# Patient Record
Sex: Male | Born: 1970
Health system: Southern US, Community
[De-identification: ages and names within clinical notes are randomized; demographics above are authoritative.]

## PROBLEM LIST (undated history)

## (undated) DIAGNOSIS — E119 Type 2 diabetes mellitus without complications: Secondary | ICD-10-CM

## (undated) DIAGNOSIS — G4730 Sleep apnea, unspecified: Secondary | ICD-10-CM

## (undated) DIAGNOSIS — Z973 Presence of spectacles and contact lenses: Secondary | ICD-10-CM

## (undated) DIAGNOSIS — K219 Gastro-esophageal reflux disease without esophagitis: Secondary | ICD-10-CM

## (undated) DIAGNOSIS — M1990 Unspecified osteoarthritis, unspecified site: Secondary | ICD-10-CM

## (undated) DIAGNOSIS — I10 Essential (primary) hypertension: Secondary | ICD-10-CM

## (undated) DIAGNOSIS — H9192 Unspecified hearing loss, left ear: Secondary | ICD-10-CM

## (undated) DIAGNOSIS — IMO0001 Reserved for inherently not codable concepts without codable children: Secondary | ICD-10-CM

## (undated) DIAGNOSIS — M069 Rheumatoid arthritis, unspecified: Secondary | ICD-10-CM

## (undated) HISTORY — PX: OTHER SURGICAL HISTORY: SHX169

## (undated) HISTORY — PX: WISDOM TOOTH EXTRACTION: SHX21

## (undated) HISTORY — PX: FINGER AMPUTATION: SHX636

---

## 2005-12-02 ENCOUNTER — Emergency Department (HOSPITAL_COMMUNITY): Admission: EM | Admit: 2005-12-02 | Discharge: 2005-12-02 | Payer: Self-pay | Admitting: Emergency Medicine

## 2010-06-23 ENCOUNTER — Emergency Department (HOSPITAL_COMMUNITY)
Admission: EM | Admit: 2010-06-23 | Discharge: 2010-06-23 | Disposition: A | Discharge status: Home / Self Care | Payer: Self-pay | Attending: Emergency Medicine | Admitting: Emergency Medicine

## 2010-06-23 ENCOUNTER — Emergency Department (HOSPITAL_COMMUNITY): Payer: Self-pay

## 2010-06-23 DIAGNOSIS — J40 Bronchitis, not specified as acute or chronic: Secondary | ICD-10-CM | POA: Insufficient documentation

## 2010-06-23 DIAGNOSIS — R05 Cough: Secondary | ICD-10-CM | POA: Insufficient documentation

## 2010-06-23 DIAGNOSIS — R079 Chest pain, unspecified: Secondary | ICD-10-CM | POA: Insufficient documentation

## 2010-06-23 DIAGNOSIS — R0602 Shortness of breath: Secondary | ICD-10-CM | POA: Insufficient documentation

## 2010-06-23 DIAGNOSIS — F17200 Nicotine dependence, unspecified, uncomplicated: Secondary | ICD-10-CM | POA: Insufficient documentation

## 2010-06-23 DIAGNOSIS — R059 Cough, unspecified: Secondary | ICD-10-CM | POA: Insufficient documentation

## 2010-06-23 DIAGNOSIS — M549 Dorsalgia, unspecified: Secondary | ICD-10-CM | POA: Insufficient documentation

## 2012-05-16 HISTORY — PX: TUMOR REMOVAL: SHX12

## 2013-08-14 ENCOUNTER — Other Ambulatory Visit (HOSPITAL_COMMUNITY): Payer: Self-pay | Admitting: Rheumatology

## 2013-08-14 ENCOUNTER — Ambulatory Visit (HOSPITAL_COMMUNITY)
Admission: RE | Admit: 2013-08-14 | Discharge: 2013-08-14 | Disposition: A | Discharge status: Home / Self Care | Payer: BC Managed Care – PPO | Source: Ambulatory Visit | Attending: Rheumatology | Admitting: Rheumatology

## 2013-08-14 DIAGNOSIS — R52 Pain, unspecified: Secondary | ICD-10-CM

## 2013-08-15 ENCOUNTER — Ambulatory Visit
Admission: RE | Admit: 2013-08-15 | Discharge: 2013-08-15 | Disposition: A | Discharge status: Home / Self Care | Payer: BC Managed Care – PPO | Source: Ambulatory Visit | Attending: Family Medicine | Admitting: Family Medicine

## 2013-08-15 ENCOUNTER — Other Ambulatory Visit: Payer: Self-pay | Admitting: Family Medicine

## 2013-08-15 DIAGNOSIS — R9389 Abnormal findings on diagnostic imaging of other specified body structures: Secondary | ICD-10-CM

## 2014-03-04 ENCOUNTER — Other Ambulatory Visit: Payer: Self-pay | Admitting: Family Medicine

## 2014-03-04 DIAGNOSIS — R911 Solitary pulmonary nodule: Secondary | ICD-10-CM

## 2014-03-07 ENCOUNTER — Ambulatory Visit
Admission: RE | Admit: 2014-03-07 | Discharge: 2014-03-07 | Disposition: A | Discharge status: Home / Self Care | Payer: BC Managed Care – PPO | Source: Ambulatory Visit | Attending: Family Medicine | Admitting: Family Medicine

## 2014-03-07 DIAGNOSIS — R911 Solitary pulmonary nodule: Secondary | ICD-10-CM

## 2015-03-05 ENCOUNTER — Other Ambulatory Visit: Payer: Self-pay | Admitting: Specialist

## 2015-03-05 DIAGNOSIS — M25511 Pain in right shoulder: Secondary | ICD-10-CM

## 2015-03-23 ENCOUNTER — Ambulatory Visit
Admission: RE | Admit: 2015-03-23 | Discharge: 2015-03-23 | Disposition: A | Discharge status: Home / Self Care | Payer: BLUE CROSS/BLUE SHIELD | Source: Ambulatory Visit | Attending: Specialist | Admitting: Specialist

## 2015-03-23 ENCOUNTER — Other Ambulatory Visit: Payer: Self-pay | Admitting: Specialist

## 2015-03-23 DIAGNOSIS — M25511 Pain in right shoulder: Secondary | ICD-10-CM

## 2015-03-23 MED ORDER — IOHEXOL 180 MG/ML  SOLN
15.0000 mL | Freq: Once | INTRAMUSCULAR | Status: DC | PRN
  Administered 2015-03-23: 15 mL via INTRA_ARTICULAR

## 2015-04-14 ENCOUNTER — Other Ambulatory Visit (HOSPITAL_COMMUNITY): Payer: Self-pay | Admitting: Orthopedic Surgery

## 2015-04-28 ENCOUNTER — Other Ambulatory Visit (HOSPITAL_COMMUNITY): Payer: Self-pay | Admitting: *Deleted

## 2015-04-28 NOTE — Pre-Procedure Instructions (Addendum)
    Deshae Jagielski  04/28/2015      [id number] [place], [place] - [address] [place] [address] Phone: [phone] Fax: [phone]    Your procedure is scheduled on Thursday, May 07, 2015 at 2:30 PM.  Report to [facility] Entrance "A" Admitting Office at 12:30 PM.  Call this number if you have problems the morning of surgery: [phone]  Any questions prior to day of surgery, please call [phone] between 8 & 4 PM.   Remember:  Do not eat food or drink liquids after midnight Wednesday, 05/06/15.  Take these medicines the morning of surgery with A SIP OF WATER: Astelin nasal spray - if needed  Stop Fish Oil and Herbal medications as of tomorrow.   Do not wear jewelry.  Do not wear lotions, powders, or cologne.  You may NOT wear deodorant.  Men may shave face and neck.  Do not bring valuables to the hospital.  [facility] is not responsible for any belongings or valuables.  Contacts, dentures or bridgework may not be worn into surgery.  Leave your suitcase in the car.  After surgery it may be brought to your room.  For patients admitted to the hospital, discharge time will be determined by your treatment team.  Patients discharged the day of surgery will not be allowed to drive home.   Special instructions:  See "Preparing for Surgery" Instruction sheet.  Please read over the following fact sheets that you were given. Pain Booklet, Coughing and Deep Breathing and Surgical Site Infection Prevention

## 2015-04-29 ENCOUNTER — Encounter (HOSPITAL_COMMUNITY): Payer: Self-pay

## 2015-04-29 ENCOUNTER — Encounter (HOSPITAL_COMMUNITY)
Admission: RE | Admit: 2015-04-29 | Discharge: 2015-04-29 | Disposition: A | Discharge status: Home / Self Care | Payer: BLUE CROSS/BLUE SHIELD | Source: Ambulatory Visit | Attending: Orthopedic Surgery | Admitting: Orthopedic Surgery

## 2015-04-29 DIAGNOSIS — M75101 Unspecified rotator cuff tear or rupture of right shoulder, not specified as traumatic: Secondary | ICD-10-CM | POA: Diagnosis not present

## 2015-04-29 DIAGNOSIS — Z01812 Encounter for preprocedural laboratory examination: Secondary | ICD-10-CM | POA: Insufficient documentation

## 2015-04-29 DIAGNOSIS — M7521 Bicipital tendinitis, right shoulder: Secondary | ICD-10-CM | POA: Insufficient documentation

## 2015-04-29 HISTORY — DX: Gastro-esophageal reflux disease without esophagitis: K21.9

## 2015-04-29 HISTORY — DX: Unspecified osteoarthritis, unspecified site: M19.90

## 2015-04-29 HISTORY — DX: Sleep apnea, unspecified: G47.30

## 2015-04-29 HISTORY — DX: Reserved for inherently not codable concepts without codable children: IMO0001

## 2015-04-29 LAB — CBC
HCT: 48.3 % (ref 39.0–52.0)
Hemoglobin: 15.7 g/dL (ref 13.0–17.0)
MCH: 28 pg (ref 26.0–34.0)
MCHC: 32.5 g/dL (ref 30.0–36.0)
MCV: 86.1 fL (ref 78.0–100.0)
PLATELETS: 239 10*3/uL (ref 150–400)
RBC: 5.61 MIL/uL (ref 4.22–5.81)
RDW: 13.5 % (ref 11.5–15.5)
WBC: 9 10*3/uL (ref 4.0–10.5)

## 2015-05-06 MED ORDER — DEXTROSE 5 % IV SOLN
3.0000 g | INTRAVENOUS | Status: AC
  Administered 2015-05-07: 3 g via INTRAVENOUS
  Filled 2015-05-06: qty 3000

## 2015-05-06 MED ORDER — CHLORHEXIDINE GLUCONATE 4 % EX LIQD: 60.0000 mL | Freq: Once | CUTANEOUS | Status: DC

## 2015-05-07 ENCOUNTER — Encounter (HOSPITAL_COMMUNITY): Payer: Self-pay | Admitting: Surgery

## 2015-05-07 ENCOUNTER — Ambulatory Visit (HOSPITAL_COMMUNITY): Payer: BLUE CROSS/BLUE SHIELD | Admitting: Certified Registered Nurse Anesthetist

## 2015-05-07 ENCOUNTER — Ambulatory Visit (HOSPITAL_COMMUNITY)
Admission: RE | Admit: 2015-05-07 | Discharge: 2015-05-07 | Disposition: A | Discharge status: Home / Self Care | Payer: BLUE CROSS/BLUE SHIELD | Source: Ambulatory Visit | Attending: Orthopedic Surgery | Admitting: Orthopedic Surgery

## 2015-05-07 ENCOUNTER — Encounter (HOSPITAL_COMMUNITY)
Admission: RE | Disposition: A | Discharge status: Home / Self Care | Payer: Self-pay | Source: Ambulatory Visit | Attending: Orthopedic Surgery

## 2015-05-07 DIAGNOSIS — M13811 Other specified arthritis, right shoulder: Secondary | ICD-10-CM | POA: Diagnosis present

## 2015-05-07 DIAGNOSIS — M7551 Bursitis of right shoulder: Secondary | ICD-10-CM | POA: Diagnosis present

## 2015-05-07 DIAGNOSIS — F17200 Nicotine dependence, unspecified, uncomplicated: Secondary | ICD-10-CM | POA: Insufficient documentation

## 2015-05-07 DIAGNOSIS — G4730 Sleep apnea, unspecified: Secondary | ICD-10-CM | POA: Diagnosis not present

## 2015-05-07 DIAGNOSIS — K219 Gastro-esophageal reflux disease without esophagitis: Secondary | ICD-10-CM | POA: Diagnosis not present

## 2015-05-07 DIAGNOSIS — Z79899 Other long term (current) drug therapy: Secondary | ICD-10-CM | POA: Insufficient documentation

## 2015-05-07 HISTORY — PX: SHOULDER ARTHROSCOPY WITH DISTAL CLAVICLE RESECTION: SHX5675

## 2015-05-07 SURGERY — SHOULDER ARTHROSCOPY WITH DISTAL CLAVICLE RESECTION
Anesthesia: Regional | Site: Shoulder | Laterality: Right

## 2015-05-07 MED ORDER — BUPIVACAINE-EPINEPHRINE (PF) 0.5% -1:200000 IJ SOLN
INTRAMUSCULAR | Status: DC | PRN
  Administered 2015-05-07: 30 mL via PERINEURAL

## 2015-05-07 MED ORDER — MIDAZOLAM HCL 2 MG/2ML IJ SOLN
INTRAMUSCULAR | Status: AC
  Administered 2015-05-07: 1 mg
  Filled 2015-05-07: qty 2

## 2015-05-07 MED ORDER — PROPOFOL 10 MG/ML IV BOLUS
INTRAVENOUS | Status: AC
  Filled 2015-05-07: qty 40

## 2015-05-07 MED ORDER — PROPOFOL 10 MG/ML IV BOLUS
INTRAVENOUS | Status: AC
  Filled 2015-05-07: qty 20

## 2015-05-07 MED ORDER — DEXTROSE 5 % IV SOLN
10.0000 mg | INTRAVENOUS | Status: DC | PRN
  Administered 2015-05-07: 15 ug/min via INTRAVENOUS

## 2015-05-07 MED ORDER — ONDANSETRON HCL 4 MG/2ML IJ SOLN: 4.0000 mg | Freq: Four times a day (QID) | INTRAMUSCULAR | Status: DC | PRN

## 2015-05-07 MED ORDER — SODIUM CHLORIDE 0.9 % IR SOLN
Status: DC | PRN
  Administered 2015-05-07: 1000 mL
  Administered 2015-05-07: 6000 mL

## 2015-05-07 MED ORDER — SUGAMMADEX SODIUM 500 MG/5ML IV SOLN
INTRAVENOUS | Status: AC
  Filled 2015-05-07: qty 5

## 2015-05-07 MED ORDER — EPINEPHRINE HCL 1 MG/ML IJ SOLN
INTRAMUSCULAR | Status: DC | PRN
  Administered 2015-05-07: .1 mL

## 2015-05-07 MED ORDER — LIDOCAINE HCL (CARDIAC) 20 MG/ML IV SOLN
INTRAVENOUS | Status: AC
  Filled 2015-05-07: qty 5

## 2015-05-07 MED ORDER — FENTANYL CITRATE (PF) 100 MCG/2ML IJ SOLN: 25.0000 ug | INTRAMUSCULAR | Status: DC | PRN

## 2015-05-07 MED ORDER — FENTANYL CITRATE (PF) 100 MCG/2ML IJ SOLN
INTRAMUSCULAR | Status: AC
  Administered 2015-05-07: 50 ug
  Filled 2015-05-07: qty 2

## 2015-05-07 MED ORDER — FENTANYL CITRATE (PF) 100 MCG/2ML IJ SOLN
INTRAMUSCULAR | Status: DC | PRN
  Administered 2015-05-07: 100 ug via INTRAVENOUS
  Administered 2015-05-07 (×3): 50 ug via INTRAVENOUS

## 2015-05-07 MED ORDER — ROCURONIUM BROMIDE 100 MG/10ML IV SOLN
INTRAVENOUS | Status: DC | PRN
  Administered 2015-05-07: 30 mg via INTRAVENOUS
  Administered 2015-05-07: 20 mg via INTRAVENOUS
  Administered 2015-05-07: 10 mg via INTRAVENOUS

## 2015-05-07 MED ORDER — FENTANYL CITRATE (PF) 250 MCG/5ML IJ SOLN
INTRAMUSCULAR | Status: AC
  Filled 2015-05-07: qty 5

## 2015-05-07 MED ORDER — ONDANSETRON HCL 4 MG/2ML IJ SOLN
INTRAMUSCULAR | Status: DC | PRN
  Administered 2015-05-07: 4 mg via INTRAVENOUS

## 2015-05-07 MED ORDER — BUPIVACAINE HCL (PF) 0.25 % IJ SOLN
INTRAMUSCULAR | Status: AC
  Filled 2015-05-07: qty 30

## 2015-05-07 MED ORDER — OXYCODONE HCL 5 MG PO TABS: 5.0000 mg | ORAL_TABLET | Freq: Once | ORAL | Status: DC | PRN

## 2015-05-07 MED ORDER — MIDAZOLAM HCL 2 MG/2ML IJ SOLN
INTRAMUSCULAR | Status: AC
  Filled 2015-05-07: qty 2

## 2015-05-07 MED ORDER — SUGAMMADEX SODIUM 500 MG/5ML IV SOLN
INTRAVENOUS | Status: DC | PRN
  Administered 2015-05-07: 325 mg via INTRAVENOUS

## 2015-05-07 MED ORDER — PROPOFOL 10 MG/ML IV BOLUS
INTRAVENOUS | Status: DC | PRN
  Administered 2015-05-07: 300 mg via INTRAVENOUS

## 2015-05-07 MED ORDER — PHENYLEPHRINE HCL 10 MG/ML IJ SOLN
INTRAMUSCULAR | Status: DC | PRN
  Administered 2015-05-07 (×5): 40 ug via INTRAVENOUS

## 2015-05-07 MED ORDER — EPINEPHRINE HCL 1 MG/ML IJ SOLN
INTRAMUSCULAR | Status: AC
  Filled 2015-05-07: qty 1

## 2015-05-07 MED ORDER — ROCURONIUM BROMIDE 50 MG/5ML IV SOLN
INTRAVENOUS | Status: AC
  Filled 2015-05-07: qty 1

## 2015-05-07 MED ORDER — LACTATED RINGERS IV SOLN
INTRAVENOUS | Status: DC
  Administered 2015-05-07 (×3): via INTRAVENOUS

## 2015-05-07 MED ORDER — SODIUM CHLORIDE 0.9 % IJ SOLN
INTRAMUSCULAR | Status: DC | PRN
  Administered 2015-05-07: 50 mL via INTRAVENOUS

## 2015-05-07 MED ORDER — OXYCODONE HCL 5 MG/5ML PO SOLN: 5.0000 mg | Freq: Once | ORAL | Status: DC | PRN

## 2015-05-07 MED ORDER — SUCCINYLCHOLINE CHLORIDE 20 MG/ML IJ SOLN
INTRAMUSCULAR | Status: DC | PRN
  Administered 2015-05-07: 160 mg via INTRAVENOUS

## 2015-05-07 SURGICAL SUPPLY — 74 items
AID PSTN UNV HD RSTRNT DISP (MISCELLANEOUS) ×1
APL SKNCLS STERI-STRIP NONHPOA (GAUZE/BANDAGES/DRESSINGS) ×1
BENZOIN TINCTURE PRP APPL 2/3 (GAUZE/BANDAGES/DRESSINGS) ×2 IMPLANT
BLADE CUTTER GATOR 3.5 (BLADE) ×2 IMPLANT
BLADE GREAT WHITE 4.2 (BLADE) ×2 IMPLANT
BLADE LONG MED 31X9 (MISCELLANEOUS) ×1 IMPLANT
BLADE SURG 11 STRL SS (BLADE) ×2 IMPLANT
BUR OVAL 4.0 (BURR) ×1 IMPLANT
BUR OVAL 6.0 (BURR) ×3 IMPLANT
COVER SURGICAL LIGHT HANDLE (MISCELLANEOUS) ×2 IMPLANT
DRAPE INCISE IOBAN 66X45 STRL (DRAPES) ×4 IMPLANT
DRAPE STERI 35X30 U-POUCH (DRAPES) ×2 IMPLANT
DRAPE U-SHAPE 47X51 STRL (DRAPES) ×4 IMPLANT
DRSG PAD ABDOMINAL 8X10 ST (GAUZE/BANDAGES/DRESSINGS) ×6 IMPLANT
DRSG TEGADERM 4X4.75 (GAUZE/BANDAGES/DRESSINGS) ×1 IMPLANT
DURAPREP 26ML APPLICATOR (WOUND CARE) ×2 IMPLANT
ELECT REM PT RETURN 9FT ADLT (ELECTROSURGICAL) ×2
ELECTRODE REM PT RTRN 9FT ADLT (ELECTROSURGICAL) ×1 IMPLANT
FILTER STRAW FLUID ASPIR (MISCELLANEOUS) ×2 IMPLANT
GAUZE SPONGE 2X2 8PLY STRL LF (GAUZE/BANDAGES/DRESSINGS) IMPLANT
GAUZE SPONGE 4X4 12PLY STRL (GAUZE/BANDAGES/DRESSINGS) ×2 IMPLANT
GAUZE XEROFORM 1X8 LF (GAUZE/BANDAGES/DRESSINGS) ×2 IMPLANT
GLOVE BIOGEL PI IND STRL 7.5 (GLOVE) ×1 IMPLANT
GLOVE BIOGEL PI IND STRL 8 (GLOVE) ×1 IMPLANT
GLOVE BIOGEL PI INDICATOR 7.5 (GLOVE) ×1
GLOVE BIOGEL PI INDICATOR 8 (GLOVE) ×1
GLOVE ECLIPSE 7.0 STRL STRAW (GLOVE) ×2 IMPLANT
GLOVE ECLIPSE 8.0 STRL XLNG CF (GLOVE) ×1 IMPLANT
GLOVE SURG ORTHO 8.0 STRL STRW (GLOVE) ×2 IMPLANT
GOWN STRL REUS W/ TWL LRG LVL3 (GOWN DISPOSABLE) IMPLANT
GOWN STRL REUS W/ TWL XL LVL3 (GOWN DISPOSABLE) ×2 IMPLANT
GOWN STRL REUS W/TWL LRG LVL3 (GOWN DISPOSABLE)
GOWN STRL REUS W/TWL XL LVL3 (GOWN DISPOSABLE) ×4
HOVERMATT SINGLE USE (MISCELLANEOUS) ×1 IMPLANT
KIT BASIN OR (CUSTOM PROCEDURE TRAY) ×2 IMPLANT
KIT ROOM TURNOVER OR (KITS) ×2 IMPLANT
MANIFOLD NEPTUNE II (INSTRUMENTS) ×2 IMPLANT
NDL HYPO 25X1 1.5 SAFETY (NEEDLE) ×1 IMPLANT
NDL SCORPION MULTI FIRE (NEEDLE) ×1 IMPLANT
NDL SPNL 18GX3.5 QUINCKE PK (NEEDLE) ×1 IMPLANT
NDL SUT 6 .5 CRC .975X.05 MAYO (NEEDLE) ×1 IMPLANT
NEEDLE HYPO 25X1 1.5 SAFETY (NEEDLE) ×2 IMPLANT
NEEDLE MAYO TAPER (NEEDLE)
NEEDLE SCORPION MULTI FIRE (NEEDLE) ×2 IMPLANT
NEEDLE SPNL 18GX3.5 QUINCKE PK (NEEDLE) ×2 IMPLANT
NS IRRIG 1000ML POUR BTL (IV SOLUTION) ×2 IMPLANT
PACK SHOULDER (CUSTOM PROCEDURE TRAY) ×2 IMPLANT
PAD ARMBOARD 7.5X6 YLW CONV (MISCELLANEOUS) ×4 IMPLANT
RESTRAINT HEAD UNIVERSAL NS (MISCELLANEOUS) ×2 IMPLANT
SET ARTHROSCOPY TUBING (MISCELLANEOUS) ×2
SET ARTHROSCOPY TUBING LN (MISCELLANEOUS) ×1 IMPLANT
SLING ARM IMMOBILIZER MED (SOFTGOODS) IMPLANT
SLING ARM IMMOBILIZER XL (CAST SUPPLIES) ×1 IMPLANT
SPONGE GAUZE 2X2 STER 10/PKG (GAUZE/BANDAGES/DRESSINGS) ×1
SPONGE LAP 4X18 X RAY DECT (DISPOSABLE) ×3 IMPLANT
STRIP CLOSURE SKIN 1/2X4 (GAUZE/BANDAGES/DRESSINGS) ×2 IMPLANT
SUCTION FRAZIER TIP 10 FR DISP (SUCTIONS) ×2 IMPLANT
SUT ETHILON 3 0 PS 1 (SUTURE) ×3 IMPLANT
SUT FIBERWIRE #2 38 T-5 BLUE (SUTURE)
SUT PROLENE 3 0 PS 2 (SUTURE) ×1 IMPLANT
SUT VIC AB 0 CT1 27 (SUTURE) ×2
SUT VIC AB 0 CT1 27XBRD ANBCTR (SUTURE) ×2 IMPLANT
SUT VIC AB 1 CT1 27 (SUTURE)
SUT VIC AB 1 CT1 27XBRD ANBCTR (SUTURE) ×1 IMPLANT
SUT VIC AB 2-0 CT1 27 (SUTURE) ×2
SUT VIC AB 2-0 CT1 TAPERPNT 27 (SUTURE) ×1 IMPLANT
SUTURE FIBERWR #2 38 T-5 BLUE (SUTURE) IMPLANT
SYR 20CC LL (SYRINGE) ×4 IMPLANT
SYR TB 1ML LUER SLIP (SYRINGE) ×2 IMPLANT
TOWEL OR 17X24 6PK STRL BLUE (TOWEL DISPOSABLE) ×2 IMPLANT
TOWEL OR 17X26 10 PK STRL BLUE (TOWEL DISPOSABLE) ×2 IMPLANT
WAND 90 DEG TURBOVAC W/CORD (SURGICAL WAND) ×1 IMPLANT
WAND HAND CNTRL MULTIVAC 90 (MISCELLANEOUS) ×1 IMPLANT
WATER STERILE IRR 1000ML POUR (IV SOLUTION) ×2 IMPLANT

## 2015-05-07 NOTE — Anesthesia Postprocedure Evaluation (Signed)
Anesthesia Post Note  Patient: Darren Sweeney  Procedure(s) Performed: Procedure(s) (LRB): SHOULDER DIAGNOSTIC OPERATIVE ARTHROSCOPY WITH DEBRIDEMENT AND DISTAL CLAVICLE EXCISION (Right)  Patient location during evaluation: PACU Anesthesia Type: General Level of consciousness: awake, awake and alert, oriented and patient cooperative Pain management: pain level controlled Vital Signs Assessment: post-procedure vital signs reviewed and stable Respiratory status: spontaneous breathing and respiratory function stable Cardiovascular status: blood pressure returned to baseline Anesthetic complications: no    Last Vitals:  Filed Vitals:   05/07/15 1958 05/07/15 2000  BP: 116/70   Pulse: 81 79  Temp: 36.9 C   Resp: 29 30    Last Pain:  Filed Vitals:   05/07/15 2006  PainSc: [facility]

## 2015-05-07 NOTE — H&P (Addendum)
Darren Sweeney is an 44 y.o. male.   Chief Complaint: Right shoulder pain HPI: Darren Sweeney 44 year old patient with long history of right shoulder pain. Bothering him now for almost a year. He localizes the pain to the superior aspect of the shoulder MRI scans consistent with [facility] joint arthropathy.  He has had an injection into the [facility] joint which did give him some but not sustained relief he presents now for operative management after failure of conservative management he has pain which interferes with his sleep as well as his range of motion of his left arm. MRI scan did not demonstrate any cuff pathology or labral pathology.But did show ac joint arthropathy  Past Medical History  Diagnosis Date  . Sleep apnea   . Shortness of breath dyspnea     on exertion  . GERD (gastroesophageal reflux disease)   . Arthritis     Past Surgical History  Procedure Laterality Date  . Finger amputation Left     ring finger amputation  . [place] teeth    . Tumor removal  2014    on voice box    History reviewed. No pertinent family history. Social History:  reports that he has been smoking Cigarettes.  He has a 42 pack-year smoking history. He does not have any smokeless tobacco history on file. He reports that he does not drink alcohol or use illicit drugs.  Allergies: No Known Allergies  Medications Prior to Admission  Medication Sig Dispense Refill  . atorvastatin (LIPITOR) 10 MG tablet Take 10 mg by mouth daily.  5  . diclofenac (VOLTAREN) 75 MG EC tablet Take 75 mg by mouth 2 (two) times daily with a meal.  2  . folic acid (FOLVITE) 1 MG tablet Take 1 mg by mouth 2 (two) times daily.    . hydroxychloroquine (PLAQUENIL) 200 MG tablet Take 400 mg by mouth daily.  0  . methotrexate 50 MG/2ML injection Inject 25 mg into the muscle once a week. Sundays  2  . Misc Natural Products (TART CHERRY ADVANCED PO) Take 1 capsule by mouth daily.    . Omega 3 1000 MG CAPS Take 1,000 mg by mouth 2 (two) times  daily.    Marland Kitchen omeprazole (PRILOSEC) 40 MG capsule Take 40 mg by mouth at bedtime.  3  . vitamin B-12 (CYANOCOBALAMIN) 500 MCG tablet Take 500 mcg by mouth daily.    Marland Kitchen azelastine (ASTELIN) 0.1 % nasal spray Place 1 spray into both nostrils daily as needed for rhinitis or allergies.   1    No results found for this or any previous visit (from the past 48 hour(s)). No results found.  Review of Systems  Constitutional: Negative.   HENT: Negative.   Eyes: Negative.   Respiratory: Negative.   Cardiovascular: Negative.   Gastrointestinal: Negative.   Genitourinary: Negative.   Musculoskeletal: Positive for joint pain.  Skin: Negative.   Neurological: Negative.   Endo/Heme/Allergies: Negative.   Psychiatric/Behavioral: Negative.     Blood pressure 136/78, pulse 76, temperature 98.5 F (36.9 C), temperature source Oral, resp. rate 26, height 5\' 10"  (1.778 m), weight 152.862 kg (337 lb), SpO2 98 %. Physical Exam  Constitutional: He appears well-developed.  HENT:  Head: Normocephalic.  Eyes: Pupils are equal, round, and reactive to light.  Neck: Normal range of motion.  Cardiovascular: Normal rate.   Respiratory: Effort normal.  Musculoskeletal: Normal range of motion.  Neurological: He is alert.  Skin: Skin is warm.   semination rightt shoulder  demonstrates full active passive range of motion AC joint tenderness is present pain with crossarm adduction present rotator cuff strength intact  Apprehension relocation testing negative O'Brien's testing no restriction of external rotation at 15 abduction   Assessment/Plan Impression is refractory acromioclavicular joint arthritis on the right shoulder plan arthroscopy distal clavicle excision subacromial decompression risk benefits discussed with patient including but not limited to  infection or vessel damage shoulder stiffness incomplete pain relief patient understands risk benefits and wisheds toproceed all questions  answered  DEAN,GREGORY SCOTT 05/07/2015, 4:41 PM

## 2015-05-07 NOTE — Anesthesia Preprocedure Evaluation (Signed)
Anesthesia Evaluation  Patient identified by MRN, date of birth, ID band Patient awake    Reviewed: Allergy & Precautions, NPO status , Patient's Chart, lab work & pertinent test results  Airway Mallampati: II   Neck ROM: full    Dental   Pulmonary shortness of breath, sleep apnea , Current Smoker,    breath sounds clear to auscultation       Cardiovascular negative cardio ROS   Rhythm:regular Rate:Normal     Neuro/Psych    GI/Hepatic GERD  ,  Endo/Other  Morbid obesity  Renal/GU      Musculoskeletal  (+) Arthritis ,   Abdominal   Peds  Hematology   Anesthesia Other Findings   Reproductive/Obstetrics                             Anesthesia Physical Anesthesia Plan  ASA: II  Anesthesia Plan: General and Regional   Post-op Pain Management: MAC Combined w/ Regional for Post-op pain   Induction: Intravenous  Airway Management Planned: Oral ETT  Additional Equipment:   Intra-op Plan:   Post-operative Plan: Extubation in OR  Informed Consent: I have reviewed the patients History and Physical, chart, labs and discussed the procedure including the risks, benefits and alternatives for the proposed anesthesia with the patient or authorized representative who has indicated his/her understanding and acceptance.     Plan Discussed with: CRNA, Anesthesiologist and Surgeon  Anesthesia Plan Comments:         Anesthesia Quick Evaluation

## 2015-05-07 NOTE — Progress Notes (Signed)
Pt has block and uses home cpap machine - will dc to home

## 2015-05-07 NOTE — Brief Op Note (Signed)
05/07/2015  7:51 PM  PATIENT:  Darren Sweeney  44 y.o. male  PRE-OPERATIVE DIAGNOSIS:  RIGHT SHOULDER ac oa  POST-OPERATIVE DIAGNOSIS:  RIGHT SHOULDER ac oa  PROCEDURE:  Procedure(s): SHOULDER DIAGNOSTIC OPERATIVE ARTHROSCOPY WITH DEBRIDEMENT AND DISTAL CLAVICLE EXCISION  SURGEON:  Surgeon(s): Meredith Pel, MD  ASSISTANT: April Green RNFA  ANESTHESIA:   general  EBL: 50 ml       BLOOD ADMINISTERED: none  DRAINS: none   LOCAL MEDICATIONS USED:  none  SPECIMEN:  No Specimen  COUNTS:  YES  TOURNIQUET:  * No tourniquets in log *  DICTATION: .Other Dictation: Dictation Number [id number]  PLAN OF CARE: Discharge to home after PACU  PATIENT DISPOSITION:  PACU - hemodynamically stable

## 2015-05-07 NOTE — Anesthesia Procedure Notes (Addendum)
Anesthesia Regional Block:  Interscalene brachial plexus block  Pre-Anesthetic Checklist: ,, timeout performed, Correct Patient, Correct Site, Correct Laterality, Correct Procedure, Correct Position, site marked, Risks and benefits discussed,  Surgical consent,  Pre-op evaluation,  At surgeon's request and post-op pain management  Laterality: Right  Prep: chloraprep       Needles:  Injection technique: Single-shot  Needle Type: Echogenic Stimulator Needle     Needle Length: 5cm 5 cm Needle Gauge: 22 and 22 G    Additional Needles:  Procedures: ultrasound guided (picture in chart) and nerve stimulator Interscalene brachial plexus block  Nerve Stimulator or Paresthesia:  Response: biceps flexion, 0.45 mA,   Additional Responses:   Narrative:  Start time: 05/07/2015 4:09 PM End time: 05/07/2015 4:17 PM Injection made incrementally with aspirations every 5 mL.  Performed by: Personally  Anesthesiologist: HODIERNE, ADAM  Additional Notes: Functioning IV was confirmed and monitors were applied.  A 67mm 22ga Arrow echogenic stimulator needle was used. Sterile prep and drape,hand hygiene and sterile gloves were used.  Negative aspiration and negative test dose prior to incremental administration of local anesthetic. The patient tolerated the procedure well.  Ultrasound guidance: relevent anatomy identified, needle position confirmed, local anesthetic spread visualized around nerve(s), vascular puncture avoided.  Image printed for medical record.    Procedure Name: Intubation Date/Time: 05/07/2015 5:35 PM Performed by: Tressia Miners LEFFEW Pre-anesthesia Checklist: Patient identified, Patient being monitored, Timeout performed, Emergency Drugs available and Suction available Patient Re-evaluated:Patient Re-evaluated prior to inductionOxygen Delivery Method: Circle System Utilized Preoxygenation: Pre-oxygenation with 100% oxygen Intubation Type: IV induction Ventilation: Mask  ventilation without difficulty Laryngoscope Size: Mac and 4 Grade View: Grade I Tube type: Oral Tube size: 7.5 mm Number of attempts: 1 Airway Equipment and Method: Stylet Placement Confirmation: ETT inserted through vocal cords under direct vision,  positive ETCO2 and breath sounds checked- equal and bilateral Secured at: 22 cm Tube secured with: Tape Dental Injury: Teeth and Oropharynx as per pre-operative assessment

## 2015-05-07 NOTE — Transfer of Care (Signed)
Immediate Anesthesia Transfer of Care Note  Patient: Darren Sweeney  Procedure(s) Performed: Procedure(s): SHOULDER DIAGNOSTIC OPERATIVE ARTHROSCOPY WITH DEBRIDEMENT AND DISTAL CLAVICLE EXCISION (Right)  Patient Location: PACU  Anesthesia Type:General  Level of Consciousness: awake and patient cooperative  Airway & Oxygen Therapy: Patient Spontanous Breathing and Patient connected to face mask oxygen  Post-op Assessment: Report given to RN and Post -op Vital signs reviewed and stable  Post vital signs: Reviewed and stable  Last Vitals:  Filed Vitals:   05/07/15 1359 05/07/15 1400  BP:    Pulse: 78 76  Temp:    Resp: 22 26    Complications: No apparent anesthesia complications

## 2015-05-08 ENCOUNTER — Encounter (HOSPITAL_COMMUNITY): Payer: Self-pay | Admitting: Orthopedic Surgery

## 2015-05-08 NOTE — Op Note (Signed)
NAME:  COMER, BUYER NO.:  [id number]  MEDICAL RECORD NO.:  [id number]  LOCATION:  [facility]                         FACILITY:  [facility]  PHYSICIAN:  Anderson Malta, M.D.    DATE OF BIRTH:  11/15/1970  DATE OF PROCEDURE: DATE OF DISCHARGE:                              OPERATIVE REPORT   PREOPERATIVE DIAGNOSIS:  Right shoulder AC joint arthritis and bursitis.  POSTOPERATIVE DIAGNOSIS:  Right shoulder AC joint arthritis and bursitis.  PROCEDURE:  Right shoulder arthroscopy, subacromial decompression without acromioplasty and attempted arthroscopic distal clavicle excision with about 90% distal clavicle removed, but with superior anterior portion unresectable due to physical limitations of his body habitus.  INDICATIONS:  Darren Sweeney is a 43 year old patient with AC joint arthritis refractory to nonoperative management presents now for operative management after explanation of risks and benefits.  PROCEDURE IN DETAIL:  The patient was brought to the operating room where general anesthetic was induced.  Preop antibiotics were administered.  Time-out was called.  Right shoulder was examined under anesthesia after he was placed in the beach-chair position with the head in neutral position.  The patient had full forward flexion, abduction, external rotation, at 15 degrees of abduction was about 60 degrees.  He had no instability.  At this time, the shoulder was prescribed with alcohol, Betadine and allowed to air dry.  Prepped with DuraPrep solution, draped in sterile manner.  Time-out was called.  The Charlie Pitter was used to cover the axilla and most of the exposed skin except for the arthroscopy portals were placed.  The posterior portal was created 2 cm medial and inferior to the posterolateral margin of the acromion. Diagnostic arthroscopy was performed.  Biceps anchor intact, glenohumeral articular surfaces were intact except for one area on the biceps tendon, which was about  the size 8 x 8 mm which had some grade 1- 2 chondral changes.  Rotator cuff was intact.  Partial thickness articular-sided tearing was present, but no full-thickness component was present, this was debrided.  The biceps tendon was otherwise intact. Solution of saline with epinephrine injected in the subacromial space around the distal clavicle area.  The patient had a very large shoulder, instruments were hugged, subacromial decompression was performed.  The patient did not have much of a bone spur or hook, therefore remaining bone work was not required.  The inferior surface of the distal clavicle was visualized in accordance with preoperative templating.  Distal clavicle excision was performed arthroscopically.  However, due to the limitations of his body habitus, the anterior-superior cortical rim could not actually be resected due to his body habitus.  Two burs were actually broken at their hub in trying bring the bur inferior enough so that its superior part would actually remove the bone.  For that reason, the instruments were removed from the arthroscopic portals, which were then closed using 2-0 Vicryl, 3-0 nylon, and 1.5 inch incision was made over the distal clavicle and the anterior-superior portion which had not been removed was excised.  The patient had full decompression of the [facility] joint space.  Thorough irrigation was performed.  Incision was closed by reapproximating the fascia using 0 Vicryl suture, 2-0 Vicryl suture, and  3-0 nylon. Waterproof dressings applied.  Sling applied.  The patient tolerated the procedure well without immediate complication.     Anderson Malta, M.D.     GSD/MEDQ  D:  05/07/2015  T:  05/07/2015  Job:  [id number]

## 2015-12-17 DIAGNOSIS — H9192 Unspecified hearing loss, left ear: Secondary | ICD-10-CM | POA: Insufficient documentation

## 2015-12-17 DIAGNOSIS — J309 Allergic rhinitis, unspecified: Secondary | ICD-10-CM | POA: Insufficient documentation

## 2015-12-17 DIAGNOSIS — J381 Polyp of vocal cord and larynx: Secondary | ICD-10-CM | POA: Insufficient documentation

## 2015-12-17 DIAGNOSIS — R1310 Dysphagia, unspecified: Secondary | ICD-10-CM | POA: Insufficient documentation

## 2015-12-17 DIAGNOSIS — R490 Dysphonia: Secondary | ICD-10-CM | POA: Insufficient documentation

## 2015-12-17 DIAGNOSIS — R911 Solitary pulmonary nodule: Secondary | ICD-10-CM | POA: Insufficient documentation

## 2015-12-17 DIAGNOSIS — IMO0001 Reserved for inherently not codable concepts without codable children: Secondary | ICD-10-CM | POA: Insufficient documentation

## 2015-12-17 DIAGNOSIS — M059 Rheumatoid arthritis with rheumatoid factor, unspecified: Secondary | ICD-10-CM | POA: Insufficient documentation

## 2015-12-17 DIAGNOSIS — E782 Mixed hyperlipidemia: Secondary | ICD-10-CM | POA: Insufficient documentation

## 2015-12-17 DIAGNOSIS — Z6841 Body Mass Index (BMI) 40.0 and over, adult: Secondary | ICD-10-CM | POA: Insufficient documentation

## 2015-12-17 DIAGNOSIS — K219 Gastro-esophageal reflux disease without esophagitis: Secondary | ICD-10-CM | POA: Insufficient documentation

## 2015-12-17 DIAGNOSIS — G4733 Obstructive sleep apnea (adult) (pediatric): Secondary | ICD-10-CM | POA: Insufficient documentation

## 2015-12-17 DIAGNOSIS — M2550 Pain in unspecified joint: Secondary | ICD-10-CM | POA: Insufficient documentation

## 2015-12-28 DIAGNOSIS — M0579 Rheumatoid arthritis with rheumatoid factor of multiple sites without organ or systems involvement: Secondary | ICD-10-CM | POA: Diagnosis not present

## 2016-01-05 DIAGNOSIS — K219 Gastro-esophageal reflux disease without esophagitis: Secondary | ICD-10-CM | POA: Diagnosis not present

## 2016-01-05 DIAGNOSIS — E782 Mixed hyperlipidemia: Secondary | ICD-10-CM | POA: Diagnosis not present

## 2016-01-07 DIAGNOSIS — K219 Gastro-esophageal reflux disease without esophagitis: Secondary | ICD-10-CM | POA: Diagnosis not present

## 2016-01-07 DIAGNOSIS — E782 Mixed hyperlipidemia: Secondary | ICD-10-CM | POA: Diagnosis not present

## 2016-01-29 DIAGNOSIS — Z79899 Other long term (current) drug therapy: Secondary | ICD-10-CM | POA: Diagnosis not present

## 2016-02-20 ENCOUNTER — Other Ambulatory Visit: Payer: Self-pay | Admitting: Radiology

## 2016-02-20 DIAGNOSIS — Z79899 Other long term (current) drug therapy: Secondary | ICD-10-CM

## 2016-03-01 ENCOUNTER — Ambulatory Visit (INDEPENDENT_AMBULATORY_CARE_PROVIDER_SITE_OTHER): Payer: BLUE CROSS/BLUE SHIELD | Admitting: Rheumatology

## 2016-03-01 DIAGNOSIS — F17200 Nicotine dependence, unspecified, uncomplicated: Secondary | ICD-10-CM

## 2016-03-01 DIAGNOSIS — M19041 Primary osteoarthritis, right hand: Secondary | ICD-10-CM | POA: Diagnosis not present

## 2016-03-01 DIAGNOSIS — Z79899 Other long term (current) drug therapy: Secondary | ICD-10-CM

## 2016-03-01 DIAGNOSIS — M545 Low back pain: Secondary | ICD-10-CM | POA: Diagnosis not present

## 2016-03-01 DIAGNOSIS — M0579 Rheumatoid arthritis with rheumatoid factor of multiple sites without organ or systems involvement: Secondary | ICD-10-CM | POA: Diagnosis not present

## 2016-03-07 ENCOUNTER — Telehealth: Payer: Self-pay | Admitting: Rheumatology

## 2016-03-07 MED ORDER — ADALIMUMAB 40 MG/0.8ML ~~LOC~~ AJKT: 1.0000 "pen " | AUTO-INJECTOR | SUBCUTANEOUS | 2 refills | Status: DC

## 2016-03-07 NOTE — Telephone Encounter (Signed)
Kathe Becton from [facility] called about auth for refill for Humira.

## 2016-03-07 NOTE — Telephone Encounter (Signed)
Resent the Humira, was previously sent in for him.

## 2016-03-21 ENCOUNTER — Telehealth: Payer: Self-pay | Admitting: Rheumatology

## 2016-03-21 NOTE — Telephone Encounter (Signed)
Patient's wife called and has questions for Apolonio Schneiders. Please advise.

## 2016-03-21 NOTE — Telephone Encounter (Signed)
Received return phone call from patient's wife.  Patient's wife reports most recent Humira prescription that patient received was for the syringes, and patient prefers pens.  Patient has been counseled on how to use the pens by me in the past.    Noted that new prescription for Humira pens was sent to [facility] on 03/07/16.  Called [facility] and spoke to the pharmacist, Lattie Haw, and verified that we would like the pens dispensed in place of the syringes.

## 2016-03-21 NOTE — Telephone Encounter (Signed)
Called patient's wife.  I left voicemail requesting return phone call.

## 2016-04-06 ENCOUNTER — Telehealth: Payer: Self-pay | Admitting: Rheumatology

## 2016-04-06 MED ORDER — METHOTREXATE SODIUM CHEMO INJECTION 50 MG/2ML: 25.0000 mg | INTRAMUSCULAR | 0 refills | Status: DC

## 2016-04-06 NOTE — Telephone Encounter (Signed)
Last visit 03/01/16 Labs WNL 01/31/16 Next visit 07/05/16 Ok to refill per Dr Estanislado Pandy

## 2016-04-06 NOTE — Telephone Encounter (Signed)
Patient is not able to get MTX from pharmacy until Monday unless the office calls it in for him. He is due to take MTX on Saturday, please [facility] [place]

## 2016-04-24 ENCOUNTER — Other Ambulatory Visit (INDEPENDENT_AMBULATORY_CARE_PROVIDER_SITE_OTHER): Payer: Self-pay | Admitting: Specialist

## 2016-04-25 ENCOUNTER — Other Ambulatory Visit: Payer: Self-pay | Admitting: *Deleted

## 2016-04-25 DIAGNOSIS — Z79899 Other long term (current) drug therapy: Secondary | ICD-10-CM

## 2016-04-25 NOTE — Telephone Encounter (Signed)
Ok to fill 

## 2016-04-26 NOTE — Telephone Encounter (Signed)
Called rx to pharm  

## 2016-05-13 DIAGNOSIS — G4733 Obstructive sleep apnea (adult) (pediatric): Secondary | ICD-10-CM | POA: Diagnosis not present

## 2016-05-30 ENCOUNTER — Other Ambulatory Visit: Payer: Self-pay | Admitting: Rheumatology

## 2016-05-31 ENCOUNTER — Other Ambulatory Visit: Payer: Self-pay | Admitting: *Deleted

## 2016-05-31 DIAGNOSIS — Z79899 Other long term (current) drug therapy: Secondary | ICD-10-CM

## 2016-05-31 LAB — CBC WITH DIFFERENTIAL/PLATELET
BASOS ABS: 0 {cells}/uL (ref 0–200)
Basophils Relative: 0 %
EOS PCT: 2 %
Eosinophils Absolute: 248 cells/uL (ref 15–500)
HCT: 43.5 % (ref 38.5–50.0)
HEMOGLOBIN: 14.4 g/dL (ref 13.2–17.1)
LYMPHS ABS: 3224 {cells}/uL (ref 850–3900)
Lymphocytes Relative: 26 %
MCH: 28.4 pg (ref 27.0–33.0)
MCHC: 33.1 g/dL (ref 32.0–36.0)
MCV: 85.8 fL (ref 80.0–100.0)
MPV: 12.2 fL (ref 7.5–12.5)
Monocytes Absolute: 868 cells/uL (ref 200–950)
Monocytes Relative: 7 %
NEUTROS ABS: 8060 {cells}/uL — AB (ref 1500–7800)
Neutrophils Relative %: 65 %
Platelets: 243 10*3/uL (ref 140–400)
RBC: 5.07 MIL/uL (ref 4.20–5.80)
RDW: 13.9 % (ref 11.0–15.0)
WBC: 12.4 10*3/uL — AB (ref 3.8–10.8)

## 2016-05-31 LAB — COMPLETE METABOLIC PANEL WITH GFR
ALBUMIN: 4.3 g/dL (ref 3.6–5.1)
ALK PHOS: 61 U/L (ref 40–115)
ALT: 48 U/L — ABNORMAL HIGH (ref 9–46)
AST: 32 U/L (ref 10–40)
BILIRUBIN TOTAL: 0.3 mg/dL (ref 0.2–1.2)
BUN: 16 mg/dL (ref 7–25)
CO2: 25 mmol/L (ref 20–31)
Calcium: 9.3 mg/dL (ref 8.6–10.3)
Chloride: 106 mmol/L (ref 98–110)
Creat: 1.12 mg/dL (ref 0.60–1.35)
GFR, Est African American: >89 mL/min (ref >=60)
GFR, Est Non African American: 79 mL/min (ref >=60)
GLUCOSE: 138 mg/dL — AB (ref 65–99)
Potassium: 3.9 mmol/L (ref 3.5–5.3)
SODIUM: 138 mmol/L (ref 135–146)
TOTAL PROTEIN: 6.9 g/dL (ref 6.1–8.1)

## 2016-05-31 NOTE — Telephone Encounter (Signed)
Okay 

## 2016-05-31 NOTE — Telephone Encounter (Signed)
Last Visit: 03/01/16 Next Visit: 07/05/16 Labs: 02/01/16 C/W prev. Labs Patient updated labs today   Okay to refill MTX?

## 2016-06-01 ENCOUNTER — Telehealth: Payer: Self-pay | Admitting: Pharmacist

## 2016-06-01 NOTE — Telephone Encounter (Signed)
I received a message from [facility], [place], regarding patient.  When patient was in the office on 05/31/16 for blood draw, patient reported he has chest discomfort after he takes his Humira.  This happens with his injections for 1 or 2 days then he feels well.    I reviewed the adverse effects of Humira.  Noted chest pain is listed as an adverse effects with incidence of <5%.  Discussed with Dr. Estanislado Pandy who recommended that patient hold his Humira at this time and see his cardiologist for work-up.  If patient is not established with cardiology, she recommended he see his primary care provider.    I called patient and advised he hold his Humira.  Patient reports he does not have a cardiologist, but confirms he does have a primary care provider.  I advised that patient see his primary care provider.  Patient voiced understanding.    Elisabeth Most, Pharm.D., BCPS, CPP Clinical Pharmacist Pager: [phone] Phone: [phone] 06/01/2016 8:26 AM

## 2016-06-02 NOTE — Progress Notes (Signed)
Mild elevation of Glu and ALT. Will monitor. Pl fax to his PCP.

## 2016-06-02 NOTE — Telephone Encounter (Signed)
Please call to check on pt. We can arrange cardiology appointment for him if needed.

## 2016-06-03 ENCOUNTER — Telehealth: Payer: Self-pay | Admitting: Rheumatology

## 2016-06-03 MED ORDER — METHOTREXATE SODIUM CHEMO INJECTION 50 MG/2ML: 20.0000 mg | INTRAMUSCULAR | 0 refills | Status: AC

## 2016-06-03 NOTE — Telephone Encounter (Signed)
Patient is requesting refill of [facility] [place]. Patient is requesting this today please.

## 2016-06-03 NOTE — Telephone Encounter (Signed)
Patient states 2-3 days after he uses Humira he has chest tightness and headache. I have told him he needs to see a cardiologist and he has declined.  He then states he will go to the Cardiologist if the chest pain returns and he is not using the Humira. He feels bad after starting the Humira and he has a lot of hand pain/ I have offered to work him in tomorrow, but he is working.     MTX sent in for him at 0.38mL dose since his liver function is slightly elevated   Will send a message to front desk to see if there is anything else to offer for sooner appt  Is there anything we can offer for his hands ?

## 2016-06-03 NOTE — Telephone Encounter (Signed)
Darren Sweeney, based on our conversation, he has stopped Humira. That is good to know since I was worried about how the Humira is affecting his chest pain.Since he's having hand pain and Humira wasn't working well and his dose of methotrexate is lowered we will have to think of another alternative for the patient.  I was unable to reach the patient and had to leave a message on his attention.I advised him to do Tylenol 500 mg pills 2 pills 3 times a day when necessary painUnable to offer higher dose of methotrexate due to liver issuesMay need to change Biologics and we will have to have a face-to-face talk with Dr. Estanislado Pandy and the patient for that to happen as soon as possible

## 2016-06-03 NOTE — Telephone Encounter (Signed)
His ALT is slightly elevated on labs from earlier in the week. ALT 48, do you want him to continue MTX at current 70mL dose ?

## 2016-06-03 NOTE — Telephone Encounter (Signed)
Decrease MTX to 0.8 ml sq qwk. He may dc Humira.

## 2016-06-15 NOTE — Telephone Encounter (Signed)
Patient has a follow-up appointment on 07/05/2016.

## 2016-06-28 DIAGNOSIS — M79642 Pain in left hand: Secondary | ICD-10-CM | POA: Diagnosis not present

## 2016-06-28 DIAGNOSIS — M150 Primary generalized (osteo)arthritis: Secondary | ICD-10-CM | POA: Diagnosis not present

## 2016-06-28 DIAGNOSIS — Z79899 Other long term (current) drug therapy: Secondary | ICD-10-CM | POA: Diagnosis not present

## 2016-06-28 DIAGNOSIS — M0579 Rheumatoid arthritis with rheumatoid factor of multiple sites without organ or systems involvement: Secondary | ICD-10-CM | POA: Diagnosis not present

## 2016-07-04 DIAGNOSIS — Z8709 Personal history of other diseases of the respiratory system: Secondary | ICD-10-CM | POA: Insufficient documentation

## 2016-07-04 DIAGNOSIS — Z8719 Personal history of other diseases of the digestive system: Secondary | ICD-10-CM | POA: Insufficient documentation

## 2016-07-04 DIAGNOSIS — M545 Low back pain, unspecified: Secondary | ICD-10-CM | POA: Insufficient documentation

## 2016-07-04 DIAGNOSIS — M0579 Rheumatoid arthritis with rheumatoid factor of multiple sites without organ or systems involvement: Secondary | ICD-10-CM | POA: Insufficient documentation

## 2016-07-04 DIAGNOSIS — M19041 Primary osteoarthritis, right hand: Secondary | ICD-10-CM | POA: Insufficient documentation

## 2016-07-04 DIAGNOSIS — Z79899 Other long term (current) drug therapy: Secondary | ICD-10-CM | POA: Insufficient documentation

## 2016-07-04 DIAGNOSIS — M19042 Primary osteoarthritis, left hand: Secondary | ICD-10-CM | POA: Insufficient documentation

## 2016-07-04 NOTE — Progress Notes (Deleted)
Office Visit Note  Patient: Darren Sweeney             Date of Birth: June 30, 1970           MRN: [id number]             PCP: No PCP Per Patient Referring: No ref. provider found Visit Date: 07/05/2016 Occupation: _0 @    Subjective:  No chief complaint on file.   History of Present Illness: Darren Sweeney is a 46 y.o. male ***   Activities of Daily Living:  Patient reports morning stiffness for *** {minute/hour:19697}.   Patient {ACTIONS;DENIES/REPORTS:21021675::"Denies"} nocturnal pain.  Difficulty dressing/grooming: {ACTIONS;DENIES/REPORTS:21021675::"Denies"} Difficulty climbing stairs: {ACTIONS;DENIES/REPORTS:21021675::"Denies"} Difficulty getting out of chair: {ACTIONS;DENIES/REPORTS:21021675::"Denies"} Difficulty using hands for taps, buttons, cutlery, and/or writing: {ACTIONS;DENIES/REPORTS:21021675::"Denies"}   No Rheumatology ROS completed.   PMFS History:  There are no active problems to display for this patient.   Past Medical History:  Diagnosis Date  . Arthritis   . GERD (gastroesophageal reflux disease)   . Shortness of breath dyspnea    on exertion  . Sleep apnea     No family history on file. Past Surgical History:  Procedure Laterality Date  . FINGER AMPUTATION Left    ring finger amputation  . SHOULDER ARTHROSCOPY WITH DISTAL CLAVICLE RESECTION Right 05/07/2015   Procedure: SHOULDER DIAGNOSTIC OPERATIVE ARTHROSCOPY WITH DEBRIDEMENT AND DISTAL CLAVICLE EXCISION;  Surgeon: Meredith Pel, MD;  Location: [facility];  Service: Orthopedics;  Laterality: Right;  . TUMOR REMOVAL  2014   on voice box  . wisdon teeth     Social History   Social History Narrative  . No narrative on file     Objective: Vital Signs: There were no vitals taken for this visit.   Physical Exam   Musculoskeletal Exam: ***  CDAI Exam: No CDAI exam completed.    Investigation: Findings:  September 2017:  Comprehensive metabolic panel showed ALT elevation of  50.  CBC was normal.  RAPID3 score today was 3.7, which is moderate severity  12/02/2015-TB Gold Neg 2016-PLQ  Orders Only on 05/31/2016  Component Date Value Ref Range Status  . WBC 05/31/2016 12.4* 3.8 - 10.8 K/uL Final  . RBC 05/31/2016 5.07  4.20 - 5.80 MIL/uL Final  . Hemoglobin 05/31/2016 14.4  13.2 - 17.1 g/dL Final  . HCT 05/31/2016 43.5  38.5 - 50.0 % Final  . MCV 05/31/2016 85.8  80.0 - 100.0 fL Final  . MCH 05/31/2016 28.4  27.0 - 33.0 pg Final  . MCHC 05/31/2016 33.1  32.0 - 36.0 g/dL Final  . RDW 05/31/2016 13.9  11.0 - 15.0 % Final  . Platelets 05/31/2016 243  140 - 400 K/uL Final  . MPV 05/31/2016 12.2  7.5 - 12.5 fL Final  . Neutro Abs 05/31/2016 8060* 1,500 - 7,800 cells/uL Final  . Lymphs Abs 05/31/2016 3224  850 - 3,900 cells/uL Final  . Monocytes Absolute 05/31/2016 868  200 - 950 cells/uL Final  . Eosinophils Absolute 05/31/2016 248  15 - 500 cells/uL Final  . Basophils Absolute 05/31/2016 0  0 - 200 cells/uL Final  . Neutrophils Relative % 05/31/2016 65  % Final  . Lymphocytes Relative 05/31/2016 26  % Final  . Monocytes Relative 05/31/2016 7  % Final  . Eosinophils Relative 05/31/2016 2  % Final  . Basophils Relative 05/31/2016 0  % Final  . Smear Review 05/31/2016 Criteria for review not met   Final  . Sodium 05/31/2016 138  135 - 146  mmol/L Final  . Potassium 05/31/2016 3.9  3.5 - 5.3 mmol/L Final  . Chloride 05/31/2016 106  98 - 110 mmol/L Final  . CO2 05/31/2016 25  20 - 31 mmol/L Final  . Glucose, Bld 05/31/2016 138* 65 - 99 mg/dL Final  . BUN 05/31/2016 16  7 - 25 mg/dL Final  . Creat 05/31/2016 1.12  0.60 - 1.35 mg/dL Final  . Total Bilirubin 05/31/2016 0.3  0.2 - 1.2 mg/dL Final  . Alkaline Phosphatase 05/31/2016 61  40 - 115 U/L Final  . AST 05/31/2016 32  10 - 40 U/L Final  . ALT 05/31/2016 48* 9 - 46 U/L Final  . Total Protein 05/31/2016 6.9  6.1 - 8.1 g/dL Final  . Albumin 05/31/2016 4.3  3.6 - 5.1 g/dL Final  . Calcium 05/31/2016 9.3  8.6 -  10.3 mg/dL Final  . GFR, Est African American 05/31/2016 >89  >=60 mL/min Final  . GFR, Est Non African American 05/31/2016 79  >=60 mL/min Final     Imaging: No results found.  Speciality Comments: No specialty comments available.    Procedures:  No procedures performed Allergies: Patient has no known allergies.   Assessment / Plan:     Visit Diagnoses: No diagnosis found.    Orders: No orders of the defined types were placed in this encounter.  No orders of the defined types were placed in this encounter.   Face-to-face time spent with patient was *** minutes. 50% of time was spent in counseling and coordination of care.  Follow-Up Instructions: No Follow-up on file.   Nayali Talerico, [place]  Note - This record has been created using [employer].  Chart creation errors have been sought, but may not always  have been located. Such creation errors do not reflect on  the standard of medical care.

## 2016-07-05 ENCOUNTER — Ambulatory Visit: Payer: BLUE CROSS/BLUE SHIELD | Admitting: Rheumatology

## 2016-07-27 ENCOUNTER — Other Ambulatory Visit (INDEPENDENT_AMBULATORY_CARE_PROVIDER_SITE_OTHER): Payer: Self-pay | Admitting: Specialist

## 2016-07-28 NOTE — Telephone Encounter (Signed)
Dicolfenac Refill Request

## 2016-08-24 DIAGNOSIS — Z79899 Other long term (current) drug therapy: Secondary | ICD-10-CM | POA: Diagnosis not present

## 2016-09-01 DIAGNOSIS — G4733 Obstructive sleep apnea (adult) (pediatric): Secondary | ICD-10-CM | POA: Diagnosis not present

## 2016-09-08 DIAGNOSIS — M0579 Rheumatoid arthritis with rheumatoid factor of multiple sites without organ or systems involvement: Secondary | ICD-10-CM | POA: Diagnosis not present

## 2016-09-08 DIAGNOSIS — M150 Primary generalized (osteo)arthritis: Secondary | ICD-10-CM | POA: Diagnosis not present

## 2016-09-08 DIAGNOSIS — Z79899 Other long term (current) drug therapy: Secondary | ICD-10-CM | POA: Diagnosis not present

## 2016-10-19 DIAGNOSIS — M0579 Rheumatoid arthritis with rheumatoid factor of multiple sites without organ or systems involvement: Secondary | ICD-10-CM | POA: Diagnosis not present

## 2016-10-19 DIAGNOSIS — Z79899 Other long term (current) drug therapy: Secondary | ICD-10-CM | POA: Diagnosis not present

## 2016-10-21 ENCOUNTER — Other Ambulatory Visit: Payer: Self-pay | Admitting: Rheumatology

## 2016-10-21 ENCOUNTER — Telehealth: Payer: Self-pay | Admitting: Rheumatology

## 2016-10-21 NOTE — Telephone Encounter (Signed)
Last visit October 2017 Last labs Jan  Last Rx MTX Jan if he is not using MTX he can d/c Folic Acid / left message for him to call back to discuss.

## 2016-10-21 NOTE — Telephone Encounter (Signed)
Called patient to schedule his follow up appointment with Dr. Estanislado Pandy.  Patient informed me that he is no longer seeing Dr. Estanislado Pandy and is seeing another rheumatologist.

## 2016-10-21 NOTE — Telephone Encounter (Signed)
-----   Message from Candice Camp, RT sent at 10/21/2016  8:56 AM EDT ----- Patient past due for appt, will you please call to make appt with Dr Estanislado Pandy ?

## 2016-12-09 DIAGNOSIS — Z79899 Other long term (current) drug therapy: Secondary | ICD-10-CM | POA: Diagnosis not present

## 2016-12-09 DIAGNOSIS — M0579 Rheumatoid arthritis with rheumatoid factor of multiple sites without organ or systems involvement: Secondary | ICD-10-CM | POA: Diagnosis not present

## 2016-12-09 DIAGNOSIS — M150 Primary generalized (osteo)arthritis: Secondary | ICD-10-CM | POA: Diagnosis not present

## 2016-12-14 DIAGNOSIS — I10 Essential (primary) hypertension: Secondary | ICD-10-CM | POA: Diagnosis not present

## 2016-12-14 DIAGNOSIS — R002 Palpitations: Secondary | ICD-10-CM | POA: Diagnosis not present

## 2016-12-14 DIAGNOSIS — M0579 Rheumatoid arthritis with rheumatoid factor of multiple sites without organ or systems involvement: Secondary | ICD-10-CM | POA: Diagnosis not present

## 2016-12-19 DIAGNOSIS — R002 Palpitations: Secondary | ICD-10-CM | POA: Diagnosis not present

## 2017-01-03 DIAGNOSIS — E782 Mixed hyperlipidemia: Secondary | ICD-10-CM | POA: Diagnosis not present

## 2017-01-03 DIAGNOSIS — R002 Palpitations: Secondary | ICD-10-CM | POA: Diagnosis not present

## 2017-01-03 DIAGNOSIS — I10 Essential (primary) hypertension: Secondary | ICD-10-CM | POA: Diagnosis not present

## 2017-01-03 DIAGNOSIS — R748 Abnormal levels of other serum enzymes: Secondary | ICD-10-CM | POA: Insufficient documentation

## 2017-01-03 DIAGNOSIS — E119 Type 2 diabetes mellitus without complications: Secondary | ICD-10-CM | POA: Insufficient documentation

## 2017-01-03 DIAGNOSIS — R7301 Impaired fasting glucose: Secondary | ICD-10-CM | POA: Diagnosis not present

## 2017-01-03 DIAGNOSIS — IMO0001 Reserved for inherently not codable concepts without codable children: Secondary | ICD-10-CM | POA: Insufficient documentation

## 2017-01-05 DIAGNOSIS — E1165 Type 2 diabetes mellitus with hyperglycemia: Secondary | ICD-10-CM | POA: Diagnosis not present

## 2017-01-05 DIAGNOSIS — I10 Essential (primary) hypertension: Secondary | ICD-10-CM | POA: Diagnosis not present

## 2017-01-05 DIAGNOSIS — E782 Mixed hyperlipidemia: Secondary | ICD-10-CM | POA: Diagnosis not present

## 2017-01-05 DIAGNOSIS — K219 Gastro-esophageal reflux disease without esophagitis: Secondary | ICD-10-CM | POA: Diagnosis not present

## 2017-01-20 ENCOUNTER — Encounter: Payer: Self-pay | Admitting: *Deleted

## 2017-02-02 DIAGNOSIS — M0579 Rheumatoid arthritis with rheumatoid factor of multiple sites without organ or systems involvement: Secondary | ICD-10-CM | POA: Diagnosis not present

## 2017-02-14 DIAGNOSIS — M0579 Rheumatoid arthritis with rheumatoid factor of multiple sites without organ or systems involvement: Secondary | ICD-10-CM | POA: Diagnosis not present

## 2017-02-14 DIAGNOSIS — Z79899 Other long term (current) drug therapy: Secondary | ICD-10-CM | POA: Diagnosis not present

## 2017-02-28 DIAGNOSIS — M0609 Rheumatoid arthritis without rheumatoid factor, multiple sites: Secondary | ICD-10-CM | POA: Diagnosis not present

## 2017-02-28 DIAGNOSIS — Z79899 Other long term (current) drug therapy: Secondary | ICD-10-CM | POA: Diagnosis not present

## 2017-03-10 DIAGNOSIS — Z79899 Other long term (current) drug therapy: Secondary | ICD-10-CM | POA: Diagnosis not present

## 2017-03-10 DIAGNOSIS — M0579 Rheumatoid arthritis with rheumatoid factor of multiple sites without organ or systems involvement: Secondary | ICD-10-CM | POA: Diagnosis not present

## 2017-03-10 DIAGNOSIS — M150 Primary generalized (osteo)arthritis: Secondary | ICD-10-CM | POA: Diagnosis not present

## 2017-04-11 DIAGNOSIS — M0579 Rheumatoid arthritis with rheumatoid factor of multiple sites without organ or systems involvement: Secondary | ICD-10-CM | POA: Diagnosis not present

## 2017-04-11 DIAGNOSIS — Z79899 Other long term (current) drug therapy: Secondary | ICD-10-CM | POA: Diagnosis not present

## 2017-05-23 DIAGNOSIS — Z716 Tobacco abuse counseling: Secondary | ICD-10-CM | POA: Insufficient documentation

## 2017-05-23 DIAGNOSIS — J449 Chronic obstructive pulmonary disease, unspecified: Secondary | ICD-10-CM | POA: Diagnosis not present

## 2017-05-23 DIAGNOSIS — F17210 Nicotine dependence, cigarettes, uncomplicated: Secondary | ICD-10-CM | POA: Diagnosis not present

## 2017-05-23 DIAGNOSIS — R911 Solitary pulmonary nodule: Secondary | ICD-10-CM | POA: Diagnosis not present

## 2017-05-23 DIAGNOSIS — G4733 Obstructive sleep apnea (adult) (pediatric): Secondary | ICD-10-CM | POA: Diagnosis not present

## 2017-05-30 DIAGNOSIS — G4733 Obstructive sleep apnea (adult) (pediatric): Secondary | ICD-10-CM | POA: Diagnosis not present

## 2017-06-06 DIAGNOSIS — M0579 Rheumatoid arthritis with rheumatoid factor of multiple sites without organ or systems involvement: Secondary | ICD-10-CM | POA: Diagnosis not present

## 2017-06-06 DIAGNOSIS — Z79899 Other long term (current) drug therapy: Secondary | ICD-10-CM | POA: Diagnosis not present

## 2017-06-30 DIAGNOSIS — K219 Gastro-esophageal reflux disease without esophagitis: Secondary | ICD-10-CM | POA: Diagnosis not present

## 2017-06-30 DIAGNOSIS — I10 Essential (primary) hypertension: Secondary | ICD-10-CM | POA: Diagnosis not present

## 2017-06-30 DIAGNOSIS — E119 Type 2 diabetes mellitus without complications: Secondary | ICD-10-CM | POA: Diagnosis not present

## 2017-06-30 DIAGNOSIS — E7800 Pure hypercholesterolemia, unspecified: Secondary | ICD-10-CM | POA: Diagnosis not present

## 2017-07-12 DIAGNOSIS — M150 Primary generalized (osteo)arthritis: Secondary | ICD-10-CM | POA: Diagnosis not present

## 2017-07-12 DIAGNOSIS — M0579 Rheumatoid arthritis with rheumatoid factor of multiple sites without organ or systems involvement: Secondary | ICD-10-CM | POA: Diagnosis not present

## 2017-07-12 DIAGNOSIS — Z79899 Other long term (current) drug therapy: Secondary | ICD-10-CM | POA: Diagnosis not present

## 2017-07-30 DIAGNOSIS — Z20828 Contact with and (suspected) exposure to other viral communicable diseases: Secondary | ICD-10-CM | POA: Diagnosis not present

## 2017-07-30 DIAGNOSIS — J209 Acute bronchitis, unspecified: Secondary | ICD-10-CM | POA: Diagnosis not present

## 2017-08-08 DIAGNOSIS — M0579 Rheumatoid arthritis with rheumatoid factor of multiple sites without organ or systems involvement: Secondary | ICD-10-CM | POA: Diagnosis not present

## 2017-09-29 ENCOUNTER — Other Ambulatory Visit (INDEPENDENT_AMBULATORY_CARE_PROVIDER_SITE_OTHER): Payer: Self-pay | Admitting: Specialist

## 2017-09-29 NOTE — Telephone Encounter (Signed)
Diclofenac refill request 

## 2017-10-10 DIAGNOSIS — Z79899 Other long term (current) drug therapy: Secondary | ICD-10-CM | POA: Diagnosis not present

## 2017-10-10 DIAGNOSIS — M0579 Rheumatoid arthritis with rheumatoid factor of multiple sites without organ or systems involvement: Secondary | ICD-10-CM | POA: Diagnosis not present

## 2017-10-25 DIAGNOSIS — M7542 Impingement syndrome of left shoulder: Secondary | ICD-10-CM | POA: Diagnosis not present

## 2017-11-06 ENCOUNTER — Ambulatory Visit (INDEPENDENT_AMBULATORY_CARE_PROVIDER_SITE_OTHER): Payer: BLUE CROSS/BLUE SHIELD

## 2017-11-06 ENCOUNTER — Other Ambulatory Visit: Payer: Self-pay | Admitting: Physician Assistant

## 2017-11-06 DIAGNOSIS — K760 Fatty (change of) liver, not elsewhere classified: Secondary | ICD-10-CM

## 2017-11-06 DIAGNOSIS — R109 Unspecified abdominal pain: Secondary | ICD-10-CM | POA: Diagnosis not present

## 2017-11-06 DIAGNOSIS — R1031 Right lower quadrant pain: Secondary | ICD-10-CM | POA: Diagnosis not present

## 2017-11-09 DIAGNOSIS — M0579 Rheumatoid arthritis with rheumatoid factor of multiple sites without organ or systems involvement: Secondary | ICD-10-CM | POA: Diagnosis not present

## 2017-11-09 DIAGNOSIS — M150 Primary generalized (osteo)arthritis: Secondary | ICD-10-CM | POA: Diagnosis not present

## 2017-12-05 DIAGNOSIS — M0579 Rheumatoid arthritis with rheumatoid factor of multiple sites without organ or systems involvement: Secondary | ICD-10-CM | POA: Diagnosis not present

## 2017-12-05 DIAGNOSIS — Z79899 Other long term (current) drug therapy: Secondary | ICD-10-CM | POA: Diagnosis not present

## 2017-12-28 DIAGNOSIS — E119 Type 2 diabetes mellitus without complications: Secondary | ICD-10-CM | POA: Diagnosis not present

## 2017-12-28 DIAGNOSIS — K219 Gastro-esophageal reflux disease without esophagitis: Secondary | ICD-10-CM | POA: Diagnosis not present

## 2017-12-28 DIAGNOSIS — E7800 Pure hypercholesterolemia, unspecified: Secondary | ICD-10-CM | POA: Diagnosis not present

## 2017-12-28 DIAGNOSIS — I10 Essential (primary) hypertension: Secondary | ICD-10-CM | POA: Diagnosis not present

## 2018-01-29 DIAGNOSIS — M0579 Rheumatoid arthritis with rheumatoid factor of multiple sites without organ or systems involvement: Secondary | ICD-10-CM | POA: Diagnosis not present

## 2018-01-29 DIAGNOSIS — M150 Primary generalized (osteo)arthritis: Secondary | ICD-10-CM | POA: Diagnosis not present

## 2018-01-29 DIAGNOSIS — Z79899 Other long term (current) drug therapy: Secondary | ICD-10-CM | POA: Diagnosis not present

## 2018-01-31 DIAGNOSIS — M0579 Rheumatoid arthritis with rheumatoid factor of multiple sites without organ or systems involvement: Secondary | ICD-10-CM | POA: Diagnosis not present

## 2018-02-23 DIAGNOSIS — G4733 Obstructive sleep apnea (adult) (pediatric): Secondary | ICD-10-CM | POA: Diagnosis not present

## 2018-03-21 ENCOUNTER — Other Ambulatory Visit (INDEPENDENT_AMBULATORY_CARE_PROVIDER_SITE_OTHER): Payer: Self-pay | Admitting: Radiology

## 2018-03-28 DIAGNOSIS — M0579 Rheumatoid arthritis with rheumatoid factor of multiple sites without organ or systems involvement: Secondary | ICD-10-CM | POA: Diagnosis not present

## 2018-03-28 DIAGNOSIS — Z79899 Other long term (current) drug therapy: Secondary | ICD-10-CM | POA: Diagnosis not present

## 2018-05-02 DIAGNOSIS — Z79899 Other long term (current) drug therapy: Secondary | ICD-10-CM | POA: Diagnosis not present

## 2018-05-02 DIAGNOSIS — M0609 Rheumatoid arthritis without rheumatoid factor, multiple sites: Secondary | ICD-10-CM | POA: Diagnosis not present

## 2018-05-02 DIAGNOSIS — E119 Type 2 diabetes mellitus without complications: Secondary | ICD-10-CM | POA: Diagnosis not present

## 2018-05-25 DIAGNOSIS — M0579 Rheumatoid arthritis with rheumatoid factor of multiple sites without organ or systems involvement: Secondary | ICD-10-CM | POA: Diagnosis not present

## 2018-06-12 DIAGNOSIS — M0579 Rheumatoid arthritis with rheumatoid factor of multiple sites without organ or systems involvement: Secondary | ICD-10-CM | POA: Diagnosis not present

## 2018-06-12 DIAGNOSIS — M150 Primary generalized (osteo)arthritis: Secondary | ICD-10-CM | POA: Diagnosis not present

## 2018-06-20 DIAGNOSIS — G4733 Obstructive sleep apnea (adult) (pediatric): Secondary | ICD-10-CM | POA: Diagnosis not present

## 2018-07-20 DIAGNOSIS — M0579 Rheumatoid arthritis with rheumatoid factor of multiple sites without organ or systems involvement: Secondary | ICD-10-CM | POA: Diagnosis not present

## 2018-09-13 DIAGNOSIS — M0579 Rheumatoid arthritis with rheumatoid factor of multiple sites without organ or systems involvement: Secondary | ICD-10-CM | POA: Diagnosis not present

## 2018-10-12 DIAGNOSIS — M0579 Rheumatoid arthritis with rheumatoid factor of multiple sites without organ or systems involvement: Secondary | ICD-10-CM | POA: Diagnosis not present

## 2018-10-12 DIAGNOSIS — Z79899 Other long term (current) drug therapy: Secondary | ICD-10-CM | POA: Diagnosis not present

## 2018-10-12 DIAGNOSIS — M150 Primary generalized (osteo)arthritis: Secondary | ICD-10-CM | POA: Diagnosis not present

## 2018-10-22 DIAGNOSIS — K219 Gastro-esophageal reflux disease without esophagitis: Secondary | ICD-10-CM | POA: Diagnosis not present

## 2018-10-22 DIAGNOSIS — I10 Essential (primary) hypertension: Secondary | ICD-10-CM | POA: Diagnosis not present

## 2018-10-22 DIAGNOSIS — E119 Type 2 diabetes mellitus without complications: Secondary | ICD-10-CM | POA: Diagnosis not present

## 2018-11-08 DIAGNOSIS — M0579 Rheumatoid arthritis with rheumatoid factor of multiple sites without organ or systems involvement: Secondary | ICD-10-CM | POA: Diagnosis not present

## 2018-11-09 DIAGNOSIS — I10 Essential (primary) hypertension: Secondary | ICD-10-CM | POA: Diagnosis not present

## 2018-11-09 DIAGNOSIS — E119 Type 2 diabetes mellitus without complications: Secondary | ICD-10-CM | POA: Diagnosis not present

## 2018-11-09 DIAGNOSIS — K219 Gastro-esophageal reflux disease without esophagitis: Secondary | ICD-10-CM | POA: Diagnosis not present

## 2019-01-03 DIAGNOSIS — M0579 Rheumatoid arthritis with rheumatoid factor of multiple sites without organ or systems involvement: Secondary | ICD-10-CM | POA: Diagnosis not present

## 2019-02-12 DIAGNOSIS — Z79899 Other long term (current) drug therapy: Secondary | ICD-10-CM | POA: Diagnosis not present

## 2019-02-12 DIAGNOSIS — M0579 Rheumatoid arthritis with rheumatoid factor of multiple sites without organ or systems involvement: Secondary | ICD-10-CM | POA: Diagnosis not present

## 2019-02-12 DIAGNOSIS — M150 Primary generalized (osteo)arthritis: Secondary | ICD-10-CM | POA: Diagnosis not present

## 2019-02-22 DIAGNOSIS — M0579 Rheumatoid arthritis with rheumatoid factor of multiple sites without organ or systems involvement: Secondary | ICD-10-CM | POA: Diagnosis not present

## 2019-03-05 DIAGNOSIS — M0579 Rheumatoid arthritis with rheumatoid factor of multiple sites without organ or systems involvement: Secondary | ICD-10-CM | POA: Diagnosis not present

## 2019-03-05 DIAGNOSIS — Z79899 Other long term (current) drug therapy: Secondary | ICD-10-CM | POA: Diagnosis not present

## 2019-04-30 DIAGNOSIS — Z79899 Other long term (current) drug therapy: Secondary | ICD-10-CM | POA: Diagnosis not present

## 2019-04-30 DIAGNOSIS — Z111 Encounter for screening for respiratory tuberculosis: Secondary | ICD-10-CM | POA: Diagnosis not present

## 2019-04-30 DIAGNOSIS — M0579 Rheumatoid arthritis with rheumatoid factor of multiple sites without organ or systems involvement: Secondary | ICD-10-CM | POA: Diagnosis not present

## 2019-05-14 IMAGING — CT CT RENAL STONE PROTOCOL
2 of 4 series · 17 of 46 positions shown, 19 images · non-contrast
Comparison: None.

CLINICAL DATA: RIGHT lower quadrant abdominal pain for 2 weeks.

EXAM:
CT ABDOMEN AND PELVIS WITHOUT CONTRAST
TECHNIQUE: Multidetector CT imaging of the abdomen and pelvis was performed
following the standard protocol without IV contrast.

[Series 2: axial st · axial · 0.98mm/px · z∈[-569,-94]mm · 14 of 105 slices shown, 16 images]
[im 5/105  soft-tissue]
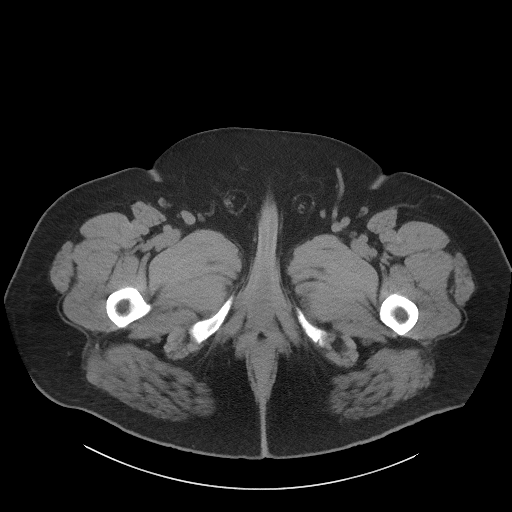
[im 5/105  bone]
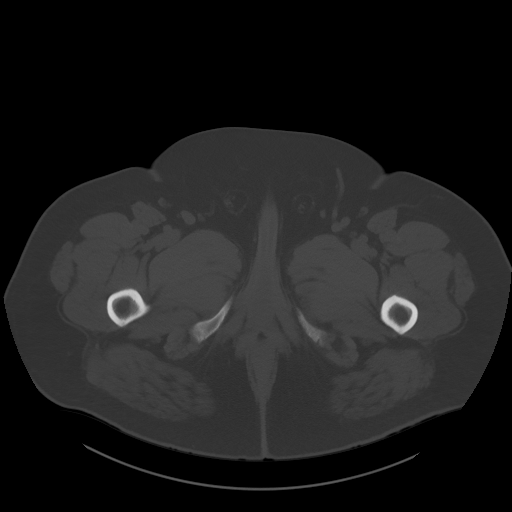
[im 14/105  soft-tissue]
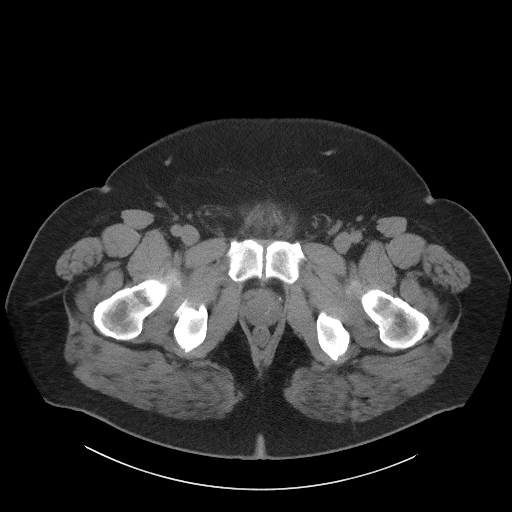
[im 19/105  soft-tissue]
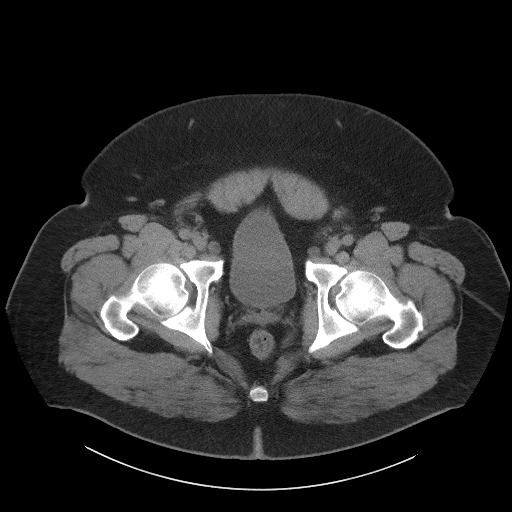
[im 28/105  soft-tissue]
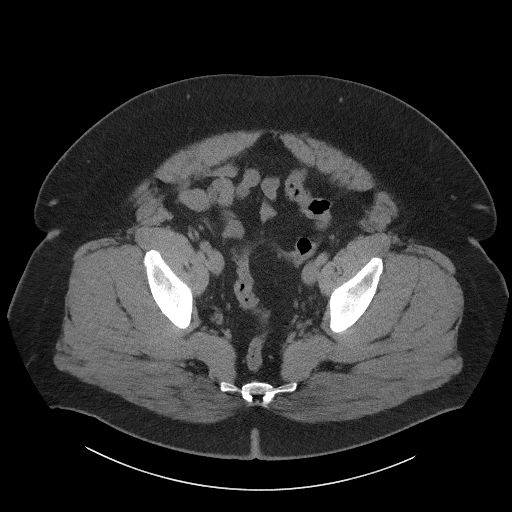
[im 37/105  soft-tissue]
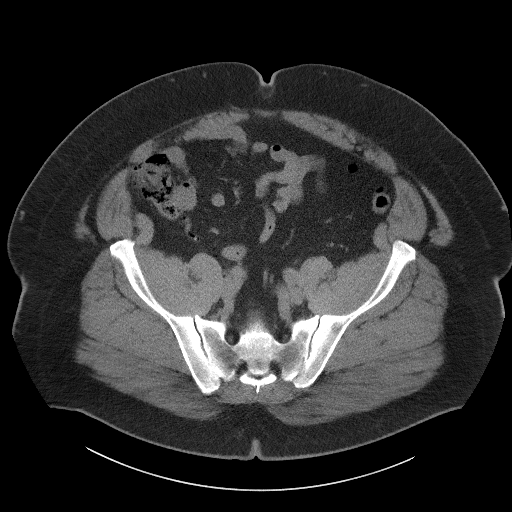
[im 41/105  soft-tissue]
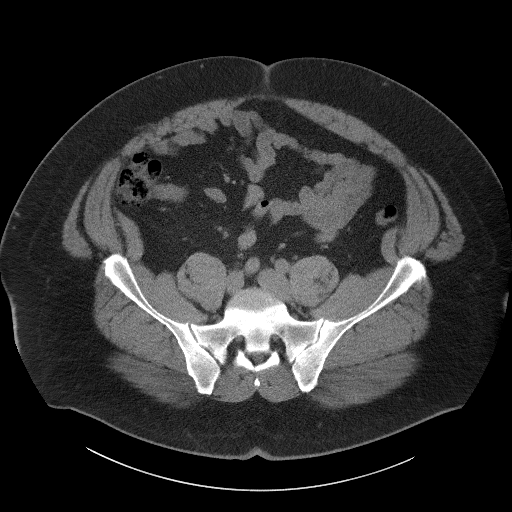
[im 50/105  soft-tissue]
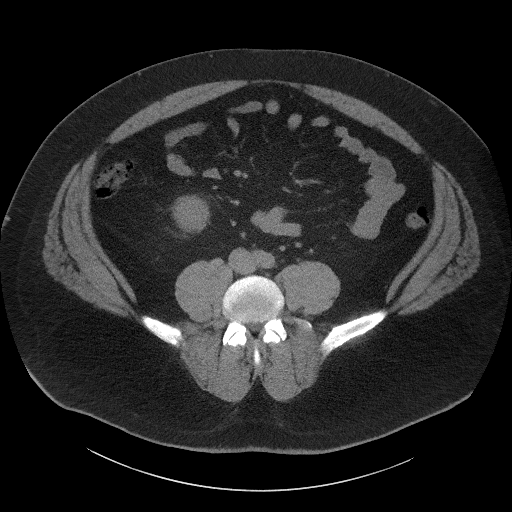
[im 55/105  soft-tissue]
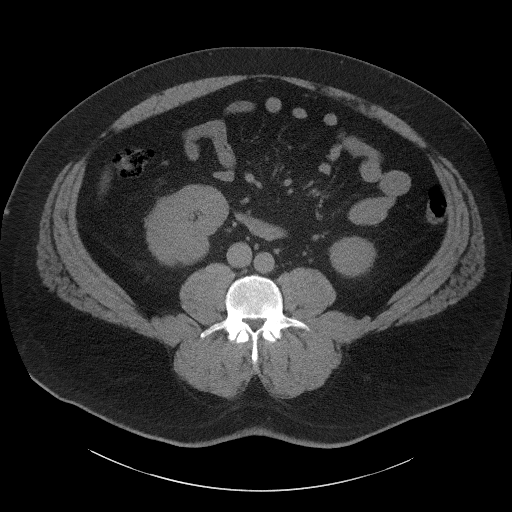
[im 64/105  soft-tissue]
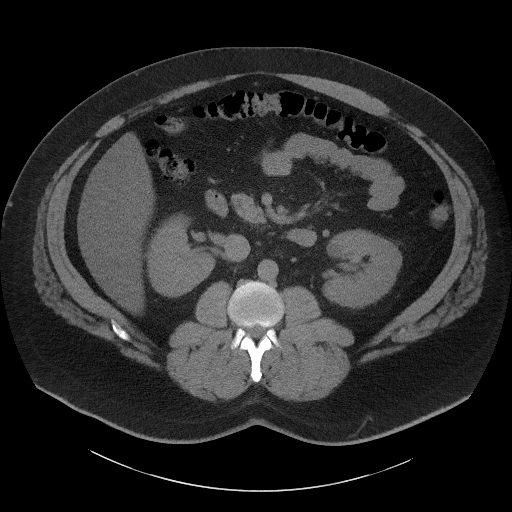
[im 64/105  bone]
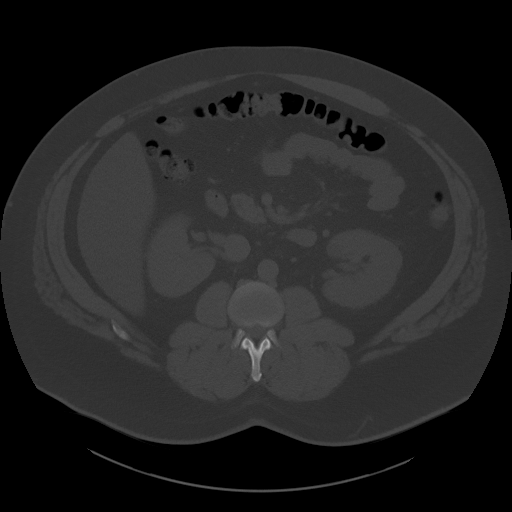
[im 68/105  soft-tissue]
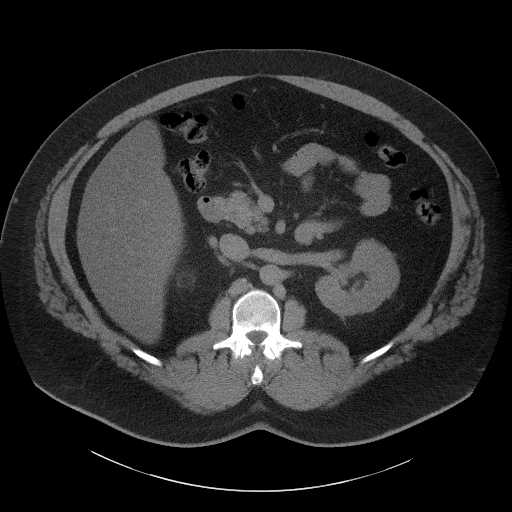
[im 77/105  soft-tissue]
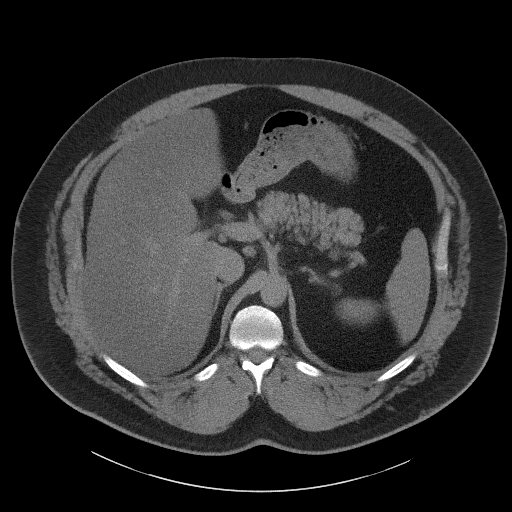
[im 86/105  soft-tissue]
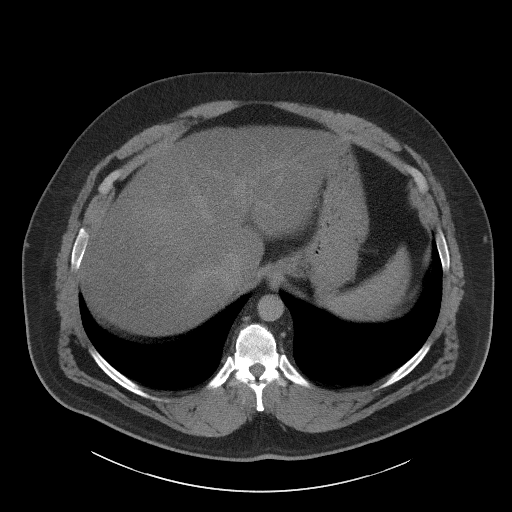
[im 91/105  soft-tissue]
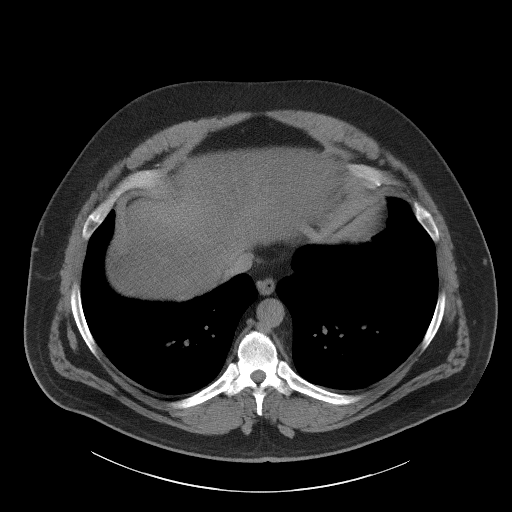
[im 100/105  soft-tissue]
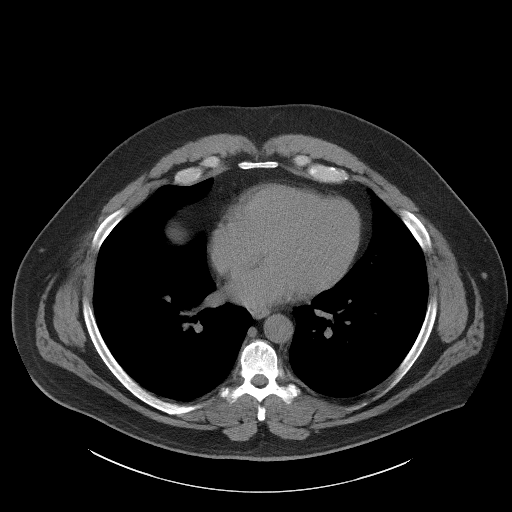

[Series 4: coronal st · coronal · 1.04mm/px · 3 of 140 slices shown]
[im 47/140  soft-tissue]
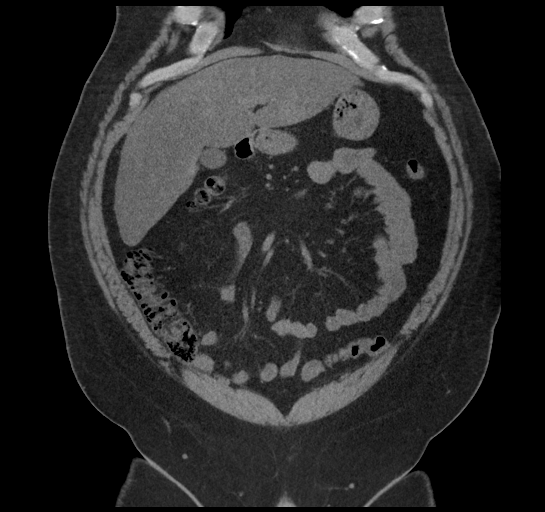
[im 62/140  soft-tissue]
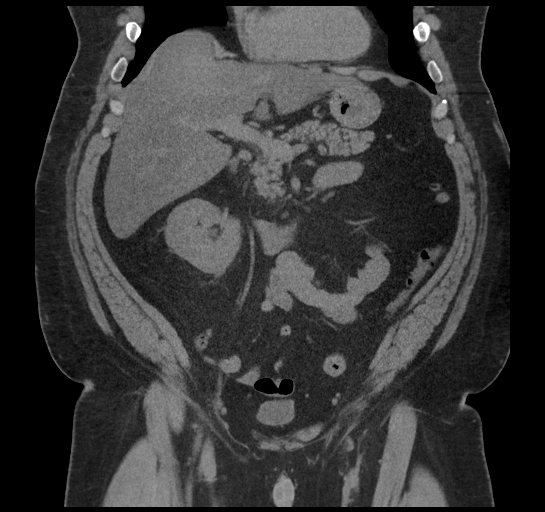
[im 78/140  soft-tissue]
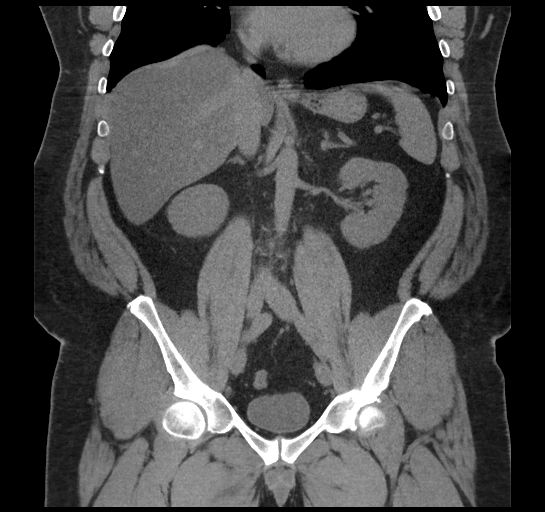

[17 of 46 positions shown; findings below may reference images not displayed]

FINDINGS: Lower chest: No acute abnormality.

Hepatobiliary: No focal liver abnormality is seen. Diffuse low
attenuation, consistent with steatosis. No gallstones, gallbladder
wall thickening, or biliary dilatation.

Pancreas: Unremarkable. No pancreatic ductal dilatation or
surrounding inflammatory changes.

Spleen: Normal in size without focal abnormality.

Adrenals/Urinary Tract: Adrenal glands are unremarkable. Kidneys are
normal, without renal calculi, focal lesion, or hydronephrosis.
Bladder is unremarkable.

Stomach/Bowel: Stomach is within normal limits. Appendix appears
normal. No evidence of bowel wall thickening, distention, or
inflammatory changes.

Vascular/Lymphatic: Negative.

Reproductive: Prostate is unremarkable.

Other: Small fat containing umbilical hernia. Increased body
habitus. No ascites.

Musculoskeletal: No acute or significant osseous findings.
IMPRESSION: No evidence for renal calculi or obstructive uropathy. Normal
appendix.

Increased body habitus with hepatic steatosis.

## 2019-05-28 ENCOUNTER — Other Ambulatory Visit (INDEPENDENT_AMBULATORY_CARE_PROVIDER_SITE_OTHER): Payer: Self-pay | Admitting: Specialist

## 2019-06-25 DIAGNOSIS — Z79899 Other long term (current) drug therapy: Secondary | ICD-10-CM | POA: Diagnosis not present

## 2019-06-25 DIAGNOSIS — M0579 Rheumatoid arthritis with rheumatoid factor of multiple sites without organ or systems involvement: Secondary | ICD-10-CM | POA: Diagnosis not present

## 2019-08-01 DIAGNOSIS — K219 Gastro-esophageal reflux disease without esophagitis: Secondary | ICD-10-CM | POA: Diagnosis not present

## 2019-08-01 DIAGNOSIS — E119 Type 2 diabetes mellitus without complications: Secondary | ICD-10-CM | POA: Diagnosis not present

## 2019-08-01 DIAGNOSIS — E7800 Pure hypercholesterolemia, unspecified: Secondary | ICD-10-CM | POA: Diagnosis not present

## 2019-08-01 DIAGNOSIS — I10 Essential (primary) hypertension: Secondary | ICD-10-CM | POA: Diagnosis not present

## 2019-08-13 DIAGNOSIS — M150 Primary generalized (osteo)arthritis: Secondary | ICD-10-CM | POA: Diagnosis not present

## 2019-08-13 DIAGNOSIS — Z79899 Other long term (current) drug therapy: Secondary | ICD-10-CM | POA: Diagnosis not present

## 2019-08-13 DIAGNOSIS — M25511 Pain in right shoulder: Secondary | ICD-10-CM | POA: Diagnosis not present

## 2019-08-13 DIAGNOSIS — M0579 Rheumatoid arthritis with rheumatoid factor of multiple sites without organ or systems involvement: Secondary | ICD-10-CM | POA: Diagnosis not present

## 2019-08-20 DIAGNOSIS — Z79899 Other long term (current) drug therapy: Secondary | ICD-10-CM | POA: Diagnosis not present

## 2019-08-20 DIAGNOSIS — M0579 Rheumatoid arthritis with rheumatoid factor of multiple sites without organ or systems involvement: Secondary | ICD-10-CM | POA: Diagnosis not present

## 2019-10-15 DIAGNOSIS — Z79899 Other long term (current) drug therapy: Secondary | ICD-10-CM | POA: Diagnosis not present

## 2019-10-15 DIAGNOSIS — M0579 Rheumatoid arthritis with rheumatoid factor of multiple sites without organ or systems involvement: Secondary | ICD-10-CM | POA: Diagnosis not present

## 2019-11-15 DIAGNOSIS — E1159 Type 2 diabetes mellitus with other circulatory complications: Secondary | ICD-10-CM | POA: Diagnosis not present

## 2019-11-15 DIAGNOSIS — E7800 Pure hypercholesterolemia, unspecified: Secondary | ICD-10-CM | POA: Diagnosis not present

## 2019-11-15 DIAGNOSIS — I10 Essential (primary) hypertension: Secondary | ICD-10-CM | POA: Diagnosis not present

## 2019-12-02 DIAGNOSIS — R195 Other fecal abnormalities: Secondary | ICD-10-CM | POA: Diagnosis not present

## 2019-12-02 DIAGNOSIS — R109 Unspecified abdominal pain: Secondary | ICD-10-CM | POA: Diagnosis not present

## 2019-12-02 DIAGNOSIS — K625 Hemorrhage of anus and rectum: Secondary | ICD-10-CM | POA: Diagnosis not present

## 2019-12-04 ENCOUNTER — Other Ambulatory Visit: Payer: Self-pay | Admitting: Gastroenterology

## 2019-12-10 DIAGNOSIS — M0579 Rheumatoid arthritis with rheumatoid factor of multiple sites without organ or systems involvement: Secondary | ICD-10-CM | POA: Diagnosis not present

## 2019-12-10 DIAGNOSIS — Z79899 Other long term (current) drug therapy: Secondary | ICD-10-CM | POA: Diagnosis not present

## 2020-01-07 DIAGNOSIS — Z7984 Long term (current) use of oral hypoglycemic drugs: Secondary | ICD-10-CM | POA: Diagnosis not present

## 2020-01-07 DIAGNOSIS — E119 Type 2 diabetes mellitus without complications: Secondary | ICD-10-CM | POA: Diagnosis not present

## 2020-01-23 ENCOUNTER — Other Ambulatory Visit: Payer: Self-pay

## 2020-01-23 ENCOUNTER — Encounter (HOSPITAL_COMMUNITY): Payer: Self-pay | Admitting: Gastroenterology

## 2020-01-31 DIAGNOSIS — Z20822 Contact with and (suspected) exposure to covid-19: Secondary | ICD-10-CM | POA: Diagnosis not present

## 2020-02-03 ENCOUNTER — Other Ambulatory Visit (HOSPITAL_COMMUNITY)
Admission: RE | Admit: 2020-02-03 | Discharge: 2020-02-03 | Disposition: A | Discharge status: Home / Self Care | Payer: BC Managed Care – PPO | Source: Ambulatory Visit | Attending: Gastroenterology | Admitting: Gastroenterology

## 2020-02-03 DIAGNOSIS — Z20822 Contact with and (suspected) exposure to covid-19: Secondary | ICD-10-CM | POA: Insufficient documentation

## 2020-02-03 DIAGNOSIS — Z01812 Encounter for preprocedural laboratory examination: Secondary | ICD-10-CM | POA: Insufficient documentation

## 2020-02-03 LAB — SARS CORONAVIRUS 2 (TAT 6-24 HRS): SARS Coronavirus 2: NEGATIVE

## 2020-02-04 DIAGNOSIS — M0579 Rheumatoid arthritis with rheumatoid factor of multiple sites without organ or systems involvement: Secondary | ICD-10-CM | POA: Diagnosis not present

## 2020-02-04 DIAGNOSIS — Z79899 Other long term (current) drug therapy: Secondary | ICD-10-CM | POA: Diagnosis not present

## 2020-02-05 DIAGNOSIS — Z20822 Contact with and (suspected) exposure to covid-19: Secondary | ICD-10-CM | POA: Diagnosis not present

## 2020-02-05 NOTE — Anesthesia Preprocedure Evaluation (Addendum)
Anesthesia Evaluation  Patient identified by MRN, date of birth, ID band Patient awake    Reviewed: Allergy & Precautions, NPO status , Patient's Chart, lab work & pertinent test results  History of Anesthesia Complications Negative for: history of anesthetic complications  Airway Mallampati: III  TM Distance: >3 FB Neck ROM: Full    Dental no notable dental hx. (+) Dental Advisory Given   Pulmonary sleep apnea , Current Smoker,    Pulmonary exam normal        Cardiovascular hypertension, Pt. on medications Normal cardiovascular exam     Neuro/Psych negative neurological ROS     GI/Hepatic negative GI ROS, Neg liver ROS,   Endo/Other  diabetesMorbid obesity  Renal/GU negative Renal ROS     Musculoskeletal negative musculoskeletal ROS (+)   Abdominal   Peds  Hematology negative hematology ROS (+)   Anesthesia Other Findings   Reproductive/Obstetrics                            Anesthesia Physical Anesthesia Plan  ASA: III  Anesthesia Plan: MAC   Post-op Pain Management:    Induction:   PONV Risk Score and Plan: 2 and Ondansetron and Propofol infusion  Airway Management Planned: Natural Airway  Additional Equipment:   Intra-op Plan:   Post-operative Plan:   Informed Consent: I have reviewed the patients History and Physical, chart, labs and discussed the procedure including the risks, benefits and alternatives for the proposed anesthesia with the patient or authorized representative who has indicated his/her understanding and acceptance.     Dental advisory given  Plan Discussed with: Anesthesiologist and CRNA  Anesthesia Plan Comments:        Anesthesia Quick Evaluation

## 2020-02-06 ENCOUNTER — Ambulatory Visit (HOSPITAL_COMMUNITY)
Admission: RE | Admit: 2020-02-06 | Discharge: 2020-02-06 | Disposition: A | Discharge status: Home / Self Care | Payer: BC Managed Care – PPO | Attending: Gastroenterology | Admitting: Gastroenterology

## 2020-02-06 ENCOUNTER — Other Ambulatory Visit: Payer: Self-pay

## 2020-02-06 ENCOUNTER — Ambulatory Visit (HOSPITAL_COMMUNITY): Payer: BC Managed Care – PPO | Admitting: Anesthesiology

## 2020-02-06 ENCOUNTER — Encounter (HOSPITAL_COMMUNITY): Payer: Self-pay | Admitting: Gastroenterology

## 2020-02-06 ENCOUNTER — Encounter (HOSPITAL_COMMUNITY)
Admission: RE | Disposition: A | Discharge status: Home / Self Care | Payer: Self-pay | Source: Home / Self Care | Attending: Gastroenterology

## 2020-02-06 DIAGNOSIS — G4730 Sleep apnea, unspecified: Secondary | ICD-10-CM | POA: Diagnosis not present

## 2020-02-06 DIAGNOSIS — K648 Other hemorrhoids: Secondary | ICD-10-CM | POA: Diagnosis not present

## 2020-02-06 DIAGNOSIS — Z79899 Other long term (current) drug therapy: Secondary | ICD-10-CM | POA: Diagnosis not present

## 2020-02-06 DIAGNOSIS — E119 Type 2 diabetes mellitus without complications: Secondary | ICD-10-CM | POA: Insufficient documentation

## 2020-02-06 DIAGNOSIS — K5730 Diverticulosis of large intestine without perforation or abscess without bleeding: Secondary | ICD-10-CM | POA: Diagnosis not present

## 2020-02-06 DIAGNOSIS — F17210 Nicotine dependence, cigarettes, uncomplicated: Secondary | ICD-10-CM | POA: Diagnosis not present

## 2020-02-06 DIAGNOSIS — K317 Polyp of stomach and duodenum: Secondary | ICD-10-CM | POA: Insufficient documentation

## 2020-02-06 DIAGNOSIS — K625 Hemorrhage of anus and rectum: Secondary | ICD-10-CM | POA: Diagnosis not present

## 2020-02-06 DIAGNOSIS — K219 Gastro-esophageal reflux disease without esophagitis: Secondary | ICD-10-CM | POA: Diagnosis not present

## 2020-02-06 DIAGNOSIS — K644 Residual hemorrhoidal skin tags: Secondary | ICD-10-CM | POA: Insufficient documentation

## 2020-02-06 DIAGNOSIS — D123 Benign neoplasm of transverse colon: Secondary | ICD-10-CM | POA: Diagnosis not present

## 2020-02-06 DIAGNOSIS — D127 Benign neoplasm of rectosigmoid junction: Secondary | ICD-10-CM | POA: Diagnosis not present

## 2020-02-06 DIAGNOSIS — Z800 Family history of malignant neoplasm of digestive organs: Secondary | ICD-10-CM | POA: Diagnosis not present

## 2020-02-06 DIAGNOSIS — R12 Heartburn: Secondary | ICD-10-CM | POA: Diagnosis not present

## 2020-02-06 DIAGNOSIS — K2970 Gastritis, unspecified, without bleeding: Secondary | ICD-10-CM | POA: Diagnosis not present

## 2020-02-06 DIAGNOSIS — K621 Rectal polyp: Secondary | ICD-10-CM | POA: Insufficient documentation

## 2020-02-06 DIAGNOSIS — I10 Essential (primary) hypertension: Secondary | ICD-10-CM | POA: Insufficient documentation

## 2020-02-06 DIAGNOSIS — K52839 Microscopic colitis, unspecified: Secondary | ICD-10-CM | POA: Diagnosis not present

## 2020-02-06 DIAGNOSIS — M1990 Unspecified osteoarthritis, unspecified site: Secondary | ICD-10-CM | POA: Diagnosis not present

## 2020-02-06 DIAGNOSIS — D125 Benign neoplasm of sigmoid colon: Secondary | ICD-10-CM | POA: Diagnosis not present

## 2020-02-06 HISTORY — PX: COLONOSCOPY WITH PROPOFOL: SHX5780

## 2020-02-06 HISTORY — DX: Essential (primary) hypertension: I10

## 2020-02-06 HISTORY — DX: Type 2 diabetes mellitus without complications: E11.9

## 2020-02-06 HISTORY — PX: BIOPSY: SHX5522

## 2020-02-06 HISTORY — DX: Presence of spectacles and contact lenses: Z97.3

## 2020-02-06 HISTORY — PX: ESOPHAGOGASTRODUODENOSCOPY (EGD) WITH PROPOFOL: SHX5813

## 2020-02-06 HISTORY — PX: POLYPECTOMY: SHX5525

## 2020-02-06 LAB — GLUCOSE, CAPILLARY: Glucose-Capillary: 168 mg/dL — ABNORMAL HIGH (ref 70–99)

## 2020-02-06 SURGERY — COLONOSCOPY WITH PROPOFOL
Anesthesia: Monitor Anesthesia Care

## 2020-02-06 MED ORDER — PROPOFOL 1000 MG/100ML IV EMUL
INTRAVENOUS | Status: AC
  Filled 2020-02-06: qty 300

## 2020-02-06 MED ORDER — LIDOCAINE HCL (CARDIAC) PF 100 MG/5ML IV SOSY
PREFILLED_SYRINGE | INTRAVENOUS | Status: DC | PRN
  Administered 2020-02-06: 100 mg via INTRAVENOUS

## 2020-02-06 MED ORDER — LABETALOL HCL 5 MG/ML IV SOLN
INTRAVENOUS | Status: DC | PRN
  Administered 2020-02-06 (×2): 5 mg via INTRAVENOUS

## 2020-02-06 MED ORDER — LACTATED RINGERS IV SOLN
INTRAVENOUS | Status: DC
  Administered 2020-02-06: 1000 mL via INTRAVENOUS

## 2020-02-06 MED ORDER — SODIUM CHLORIDE 0.9 % IV SOLN: INTRAVENOUS | Status: DC

## 2020-02-06 MED ORDER — PROPOFOL 500 MG/50ML IV EMUL
INTRAVENOUS | Status: DC | PRN
  Administered 2020-02-06: 200 ug/kg/min via INTRAVENOUS

## 2020-02-06 MED ORDER — GLYCOPYRROLATE 0.2 MG/ML IJ SOLN
INTRAMUSCULAR | Status: DC | PRN
  Administered 2020-02-06: .2 mg via INTRAVENOUS

## 2020-02-06 MED ORDER — PROPOFOL 500 MG/50ML IV EMUL
INTRAVENOUS | Status: AC
  Filled 2020-02-06: qty 150

## 2020-02-06 SURGICAL SUPPLY — 25 items

## 2020-02-06 NOTE — Anesthesia Procedure Notes (Addendum)
Procedure Name: MAC Date/Time: 02/06/2020 8:21 AM Performed by: Lissa Morales, CRNA Pre-anesthesia Checklist: Patient identified, Emergency Drugs available, Suction available, Patient being monitored and Timeout performed Patient Re-evaluated:Patient Re-evaluated prior to induction Oxygen Delivery Method: Simple face mask Preoxygenation: Pre-oxygenation with 100% oxygen Placement Confirmation: positive ETCO2

## 2020-02-06 NOTE — Anesthesia Postprocedure Evaluation (Signed)
Anesthesia Post Note  Patient: Darren Sweeney  Procedure(s) Performed: COLONOSCOPY WITH PROPOFOL (N/A ) ESOPHAGOGASTRODUODENOSCOPY (EGD) WITH PROPOFOL (N/A ) BIOPSY POLYPECTOMY     Patient location during evaluation: Endoscopy Anesthesia Type: MAC Level of consciousness: awake and alert Pain management: pain level controlled Vital Signs Assessment: post-procedure vital signs reviewed and stable Respiratory status: spontaneous breathing, nonlabored ventilation, respiratory function stable and patient connected to nasal cannula oxygen Cardiovascular status: blood pressure returned to baseline and stable Postop Assessment: no apparent nausea or vomiting Anesthetic complications: no   No complications documented.  Last Vitals:  Vitals:   02/06/20 0930 02/06/20 0940  BP: 133/68 129/79  Pulse: 89 89  Resp: (!) 26 (!) 21  Temp:    SpO2: 100% 99%    Last Pain:  Vitals:   02/06/20 0940  TempSrc:   PainSc: 0-No pain                 Giovanni Bath DANIEL

## 2020-02-06 NOTE — Discharge Instructions (Signed)

## 2020-02-06 NOTE — Op Note (Signed)
[facility] Patient Name: Darren Sweeney Procedure Date: 02/06/2020 MRN: [id number] Attending MD: Otis Brace , MD Date of Birth: Aug 02, 1970 CSN: [id number] Age: 49 Admit Type: Outpatient Procedure:                Colonoscopy Indications:              This is the patient's first colonoscopy, Rectal                            bleeding, Family history of colon cancer Providers:                Otis Brace, MD, Carmie End, RN, Erenest Rasher, RN, Ladona Ridgel, Technician, Enrigue Catena, CRNA Referring MD:              Medicines:                Sedation Administered by an Anesthesia Professional Complications:            No immediate complications. Estimated Blood Loss:     Estimated blood loss was minimal. Procedure:                Pre-Anesthesia Assessment:                           - Prior to the procedure, a History and Physical                            was performed, and patient medications and                            allergies were reviewed. The patient's tolerance of                            previous anesthesia was also reviewed. The risks                            and benefits of the procedure and the sedation                            options and risks were discussed with the patient.                            All questions were answered, and informed consent                            was obtained. Prior Anticoagulants: The patient has                            taken no previous anticoagulant or antiplatelet  agents. ASA Grade Assessment: III - A patient with                            severe systemic disease. After reviewing the risks                            and benefits, the patient was deemed in                            satisfactory condition to undergo the procedure.                           - Prior to the procedure, a History and Physical                             was performed, and patient medications and                            allergies were reviewed. The patient's tolerance of                            previous anesthesia was also reviewed. The risks                            and benefits of the procedure and the sedation                            options and risks were discussed with the patient.                            All questions were answered, and informed consent                            was obtained. Prior Anticoagulants: The patient has                            taken no previous anticoagulant or antiplatelet                            agents. ASA Grade Assessment: III - A patient with                            severe systemic disease. After reviewing the risks                            and benefits, the patient was deemed in                            satisfactory condition to undergo the procedure.                           After obtaining informed consent, the colonoscope  was passed under direct vision. Throughout the                            procedure, the patient's blood pressure, pulse, and                            oxygen saturations were monitored continuously. The                            [id number]-H190DL ([id number] Olympus pediatric colonscope                            was introduced through the anus and advanced to the                            the terminal ileum, with identification of the                            appendiceal orifice and IC valve. The colonoscopy                            was extremely difficult due to the patient's body                            habitus. Successful completion of the procedure was                            aided by increasing the dose of sedation medication                            and applying abdominal pressure. The patient                            tolerated the procedure well. The quality of the                             bowel preparation was adequate to identify polyps 6                            mm and larger in size. Scope In: 8:44:59 AM Scope Out: 9:05:27 AM Scope Withdrawal Time: 0 hours 15 minutes 4 seconds  Total Procedure Duration: 0 hours 20 minutes 28 seconds  Findings:      Hemorrhoids were found on perianal exam.      The terminal ileum appeared normal.      Normal mucosa was found in the entire colon. Biopsies for histology were       taken with a cold forceps from the ascending colon and descending colon       for evaluation of microscopic colitis.      A 3 mm polyp was found in the transverse colon. The polyp was sessile.       The polyp was removed with a cold biopsy forceps. Resection and       retrieval were complete.      Two sessile polyps were  found in the recto-sigmoid colon. The polyps       were diminutive in size. These polyps were removed with a cold biopsy       forceps. Resection and retrieval were complete.      Two hyperplastic polyps were found in the distal rectum. The polyps were       2 to 3 mm in size. These polyps were removed with a cold biopsy forceps.       Resection and retrieval were complete.      A few diverticula were found in the sigmoid colon.      Internal hemorrhoids were found during retroflexion. The hemorrhoids       were large. Impression:               - Hemorrhoids found on perianal exam.                           - The examined portion of the ileum was normal.                           - Normal mucosa in the entire examined colon.                            Biopsied.                           - One 3 mm polyp in the transverse colon, removed                            with a cold biopsy forceps. Resected and retrieved.                           - Two diminutive polyps at the recto-sigmoid colon,                            removed with a cold biopsy forceps. Resected and                            retrieved.                           - Two 2 to 3  mm polyps in the distal rectum,                            removed with a cold biopsy forceps. Resected and                            retrieved.                           - Diverticulosis in the sigmoid colon.                           - Internal hemorrhoids. Moderate Sedation:      Moderate (conscious) sedation was personally administered by an       anesthesia professional. The following parameters were monitored: oxygen  saturation, heart rate, blood pressure, and response to care. Recommendation:           - Patient has a contact number available for                            emergencies. The signs and symptoms of potential                            delayed complications were discussed with the                            patient. Return to normal activities tomorrow.                            Written discharge instructions were provided to the                            patient.                           - Resume previous diet.                           - Continue present medications.                           - Await pathology results.                           - Repeat colonoscopy date to be determined after                            pending pathology results are reviewed for                            surveillance based on pathology results.                           - Return to my office as previously scheduled. Procedure Code(s):        --- Professional ---                           [phone], Colonoscopy, flexible; with biopsy, single                            or multiple Diagnosis Code(s):        --- Professional ---                           K64.8, Other hemorrhoids                           K63.5, Polyp of colon                           K62.1, Rectal polyp  K62.5, Hemorrhage of anus and rectum                           Z80.0, Family history of malignant neoplasm of                            digestive organs                            K57.30, Diverticulosis of large intestine without                            perforation or abscess without bleeding CPT copyright 2019 [employer]. All rights reserved. The codes documented in this report are preliminary and upon coder review may  be revised to meet current compliance requirements. Otis Brace, MD Otis Brace, MD 02/06/2020 9:20:14 AM Number of Addenda: 0

## 2020-02-06 NOTE — H&P (Signed)
Primary Care Physician:  [facility] Primary Gastroenterologist:  Dr. Alessandra Bevels  Reason for Visit : Abdominal discomfort, rectal bleeding, chronic GERD  HPI: Darren Sweeney is a 49 y.o. male was seen in the office for evaluation of lower abdominal discomfort and rectal bleeding. History of chronic reflux well controlled with omeprazole. Blood work in July 2021 showed normal CBC, CMP, lipase and CRP.  Continues to have rectal bleeding. Abdominal discomfort has improved. He used to have 2-3 bowel movements per day but now having 4-6 bowel movements per day. Improvement in symptoms with as needed hyoscyamine.         Maternal grandmother was diagnosed with colon cancer in her late 81s or early 107s.        No previous EGD or colonoscopy  Past Medical History:  Diagnosis Date  . Arthritis   . Diabetes mellitus without complication ([place])   . GERD (gastroesophageal reflux disease)   . Hypertension   . Shortness of breath dyspnea    on exertion  . Sleep apnea   . Wears glasses     Past Surgical History:  Procedure Laterality Date  . FINGER AMPUTATION Left    ring finger amputation  . SHOULDER ARTHROSCOPY WITH DISTAL CLAVICLE RESECTION Right 05/07/2015   Procedure: SHOULDER DIAGNOSTIC OPERATIVE ARTHROSCOPY WITH DEBRIDEMENT AND DISTAL CLAVICLE EXCISION;  Surgeon: Meredith Pel, MD;  Location: [place];  Service: Orthopedics;  Laterality: Right;  . TUMOR REMOVAL  2014   on voice box  . wisdon teeth      Prior to Admission medications   Medication Sig Start Date End Date Taking? Authorizing Provider  APPLE CIDER VINEGAR PO Take 1,200 mg by mouth every evening.   Yes [provider]  atorvastatin (LIPITOR) 10 MG tablet Take 10 mg by mouth every morning.   Yes [provider]  Bacillus Coagulans-Inulin (ALIGN PREBIOTIC-PROBIOTIC PO) Take 1 capsule by mouth daily.    Yes [provider]  etodolac (LODINE) 500 MG tablet Take 500 mg by  mouth 2 (two) times daily.   Yes [provider]  ezetimibe (ZETIA) 10 MG tablet Take 10 mg by mouth every morning.   Yes [provider]  fenofibrate micronized (LOFIBRA) 200 MG capsule Take 200 mg by mouth daily. 10/30/19  Yes [provider]  folic acid (FOLVITE) 1 MG tablet Take 1 mg by mouth 2 (two) times daily.   Yes [provider]  hyoscyamine (LEVSIN SL) 0.125 MG SL tablet Place 0.125 mg under the tongue every 6 (six) hours as needed for cramping.  12/02/19  Yes [provider]  losartan (COZAAR) 25 MG tablet Take 25 mg by mouth daily.    Yes [provider]  metFORMIN (GLUCOPHAGE) 1000 MG tablet Take 1,000 mg by mouth 2 (two) times daily with a meal.   Yes [provider]  methotrexate 50 MG/2ML injection Inject 0.8 mLs (20 mg total) into the skin once a week. Patient taking differently: Inject 20 mg into the skin every Sunday. Every Sunday 06/03/16  Yes Deveshwar, Abel Presto, MD  Misc Natural Products (TART CHERRY ADVANCED PO) Take 1 capsule by mouth every morning.    Yes [provider]  Omega 3 1200 MG CAPS Take 1,200 mg by mouth daily.    Yes [provider]  omeprazole (PRILOSEC) 40 MG capsule Take 40 mg by mouth daily.  03/30/15  Yes [provider]  etodolac (LODINE XL) 500 MG 24 hr tablet Take 500  mg by mouth 2 (two) times daily. Patient not taking: Reported on 01/29/2020 11/22/19   [provider]  FENOFIBRATE PO Take 200 mg by mouth daily. Patient not taking: Reported on 01/29/2020    [provider]  hydrocortisone (ANUSOL-HC) 25 MG suppository Place 25 mg rectally at bedtime. Patient not taking: Reported on 01/29/2020 12/02/19   [provider]  hyoscyamine (ANASPAZ) 0.125 MG TBDP disintergrating tablet Place 0.125 mg under the tongue 2 (two) times daily as needed. Patient not taking: Reported on 01/29/2020    [provider]  OVER THE COUNTER MEDICATION 1,200 mg  every evening. [place] Patient not taking: Reported on 01/29/2020    [provider]    Scheduled Meds: Continuous Infusions: . sodium chloride    . lactated ringers 1,000 mL (02/06/20 0751)   PRN Meds:.  Allergies as of 12/04/2019  . (No Known Allergies)    History reviewed. No pertinent family history.  Social History   Socioeconomic History  . Marital status: Married    Spouse name: Not on file  . Number of children: Not on file  . Years of education: Not on file  . Highest education level: Not on file  Occupational History  . Not on file  Tobacco Use  . Smoking status: Current Every Day Smoker    Packs/day: 1.50    Years: 28.00    Pack years: 42.00    Types: Cigarettes  Substance and Sexual Activity  . Alcohol use: No  . Drug use: No  . Sexual activity: Not on file  Other Topics Concern  . Not on file  Social History Narrative  . Not on file   Social Determinants of Health   Financial Resource Strain:   . Difficulty of Paying Living Expenses: Not on file  Food Insecurity:   . Worried About Charity fundraiser in the Last Year: Not on file  . Ran Out of Food in the Last Year: Not on file  Transportation Needs:   . Lack of Transportation (Medical): Not on file  . Lack of Transportation (Non-Medical): Not on file  Physical Activity:   . Days of Exercise per Week: Not on file  . Minutes of Exercise per Session: Not on file  Stress:   . Feeling of Stress : Not on file  Social Connections:   . Frequency of Communication with Friends and Family: Not on file  . Frequency of Social Gatherings with Friends and Family: Not on file  . Attends Religious Services: Not on file  . Active Member of Clubs or Organizations: Not on file  . Attends Archivist Meetings: Not on file  . Marital Status: Not on file  Intimate Partner Violence:   . Fear of Current or Ex-Partner: Not on file  . Emotionally Abused: Not on file  . Physically  Abused: Not on file  . Sexually Abused: Not on file      Physical Exam: Vital signs: Vitals:   02/06/20 0719  Pulse: 94  Resp: (!) 25  Temp: 98.2 F (36.8 C)  SpO2: 96%     General:   Alert, obese, pleasant and cooperative in NAD Lungs:  Clear throughout to auscultation.   No wheezes, crackles, or rhonchi. No acute distress. Heart:  Regular rate and rhythm; no murmurs, clicks, rubs,  or gallops. Abdomen: Soft, nontender, nondistended, bowel sounds present Rectal:  Deferred  GI:  Lab Results: No results for input(s): WBC, HGB, HCT, PLT in the  last 72 hours. BMET No results for input(s): NA, K, CL, CO2, GLUCOSE, BUN, CREATININE, CALCIUM in the last 72 hours. LFT No results for input(s): PROT, ALBUMIN, AST, ALT, ALKPHOS, BILITOT, BILIDIR, IBILI in the last 72 hours. PT/INR No results for input(s): LABPROT, INR in the last 72 hours.   Studies/Results: No results found.  Impression/Plan: -Rectal bleeding -Lower abdominal discomfort - loose stools  -Chronic GERD  Recommendations ---------------------- -Proceed with EGD and colonoscopy today.  Risks (bleeding, infection, bowel perforation that could require surgery, sedation-related changes in cardiopulmonary systems), benefits (identification and possible treatment of source of symptoms, exclusion of certain causes of symptoms), and alternatives (watchful waiting, radiographic imaging studies, empiric medical treatment)  were explained to patient in detail and patient wishes to proceed.    LOS: 0 days   Otis Brace  MD, FACP 02/06/2020, 8:12 AM  Contact #  [phone]

## 2020-02-06 NOTE — Transfer of Care (Signed)
Immediate Anesthesia Transfer of Care Note  Patient: [name]  Procedure(s) Performed: COLONOSCOPY WITH PROPOFOL (N/A ) ESOPHAGOGASTRODUODENOSCOPY (EGD) WITH PROPOFOL (N/A ) BIOPSY POLYPECTOMY  Patient Location: PACU  Anesthesia Type:MAC  Level of Consciousness: awake, alert , oriented and patient cooperative  Airway & Oxygen Therapy: Patient Spontanous Breathing and Patient connected to face mask oxygen  Post-op Assessment: Report given to RN, Post -op Vital signs reviewed and stable and Patient moving all extremities X 4  Post vital signs: stable  Last Vitals:  Vitals Value Taken Time  BP 115/63 02/06/20 0925  Temp 36.6 C 02/06/20 0925  Pulse 92 02/06/20 0931  Resp 19 02/06/20 0931  SpO2 98 % 02/06/20 0931  Vitals shown include unvalidated device data.  Last Pain:  Vitals:   02/06/20 0925  TempSrc: Oral  PainSc: 0-No pain         Complications: No complications documented.

## 2020-02-06 NOTE — Op Note (Addendum)
[facility] Patient Name: Darren Sweeney Procedure Date: 02/06/2020 MRN: [id number] Attending MD: Otis Brace , MD Date of Birth: Aug 29, 1970 CSN: [id number] Age: 49 Admit Type: Outpatient Procedure:                Upper GI endoscopy Indications:              Screening for Barrett's esophagus in patient at                            risk for this condition, Heartburn Providers:                Otis Brace, MD, Carmie End, RN, Erenest Rasher, RN, Ladona Ridgel, Technician, Enrigue Catena, CRNA Referring MD:              Medicines:                Sedation Administered by an Anesthesia Professional Complications:            No immediate complications. Estimated Blood Loss:     Estimated blood loss was minimal. Procedure:                Pre-Anesthesia Assessment:                           - Prior to the procedure, a History and Physical                            was performed, and patient medications and                            allergies were reviewed. The patient's tolerance of                            previous anesthesia was also reviewed. The risks                            and benefits of the procedure and the sedation                            options and risks were discussed with the patient.                            All questions were answered, and informed consent                            was obtained. Prior Anticoagulants: The patient has                            taken no previous anticoagulant or antiplatelet  agents. ASA Grade Assessment: III - A patient with                            severe systemic disease. After reviewing the risks                            and benefits, the patient was deemed in                            satisfactory condition to undergo the procedure.                           After obtaining informed consent, the endoscope was                             passed under direct vision. Throughout the                            procedure, the patient's blood pressure, pulse, and                            oxygen saturations were monitored continuously. The                            GIF-H190 ([id number]) Olympus gastroscope was                            introduced through the mouth, and advanced to the                            second part of duodenum. The upper GI endoscopy was                            performed with moderate difficulty due to the                            patient's body habitus. Successful completion of                            the procedure was aided by increasing the dose of                            sedation medication. The patient tolerated the                            procedure well. Scope In: Scope Out: Findings:      The Z-line was regular and was found 42 cm from the incisors.      Scattered minimal inflammation was found in the gastric body and in the       gastric antrum. Biopsies were taken with a cold forceps for histology.      A few diminutive sessile polyps were found in the cardia and in the       gastric body. Biopsies were taken with a cold  forceps for histology.      The duodenal bulb, first portion of the duodenum and second portion of       the duodenum were normal. Impression:               - Z-line regular, 42 cm from the incisors.                           - Gastritis. Biopsied.                           - A few gastric polyps. Biopsied.                           - Normal duodenal bulb, first portion of the                            duodenum and second portion of the duodenum. Moderate Sedation:      Moderate (conscious) sedation was personally administered by an       anesthesia professional. The following parameters were monitored: oxygen       saturation, heart rate, blood pressure, and response to care. Recommendation:           - Patient has a contact number  available for                            emergencies. The signs and symptoms of potential                            delayed complications were discussed with the                            patient. Return to normal activities tomorrow.                            Written discharge instructions were provided to the                            patient.                           - Resume previous diet.                           - Continue present medications.                           - Await pathology results.                           - Perform a colonoscopy today. Procedure Code(s):        --- Professional ---                           [phone], Esophagogastroduodenoscopy, flexible,                            transoral; with biopsy,  single or multiple Diagnosis Code(s):        --- Professional ---                           K29.70, Gastritis, unspecified, without bleeding                           K31.7, Polyp of stomach and duodenum                           Z13.810, Encounter for screening for upper                            gastrointestinal disorder                           R12, Heartburn CPT copyright 2019 [employer]. All rights reserved. The codes documented in this report are preliminary and upon coder review may  be revised to meet current compliance requirements. Otis Brace, MD Otis Brace, MD 02/06/2020 9:13:50 AM Number of Addenda: 0

## 2020-02-07 ENCOUNTER — Encounter (HOSPITAL_COMMUNITY): Payer: Self-pay | Admitting: Gastroenterology

## 2020-02-07 ENCOUNTER — Other Ambulatory Visit: Payer: Self-pay

## 2020-02-07 LAB — SURGICAL PATHOLOGY

## 2020-02-13 DIAGNOSIS — M0579 Rheumatoid arthritis with rheumatoid factor of multiple sites without organ or systems involvement: Secondary | ICD-10-CM | POA: Diagnosis not present

## 2020-02-13 DIAGNOSIS — M150 Primary generalized (osteo)arthritis: Secondary | ICD-10-CM | POA: Diagnosis not present

## 2020-02-13 DIAGNOSIS — M25511 Pain in right shoulder: Secondary | ICD-10-CM | POA: Diagnosis not present

## 2020-02-13 DIAGNOSIS — Z79899 Other long term (current) drug therapy: Secondary | ICD-10-CM | POA: Diagnosis not present

## 2020-02-20 DIAGNOSIS — Z20822 Contact with and (suspected) exposure to covid-19: Secondary | ICD-10-CM | POA: Diagnosis not present

## 2020-02-27 ENCOUNTER — Ambulatory Visit: Payer: Self-pay | Admitting: Surgery

## 2020-02-27 DIAGNOSIS — Z720 Tobacco use: Secondary | ICD-10-CM | POA: Insufficient documentation

## 2020-02-27 DIAGNOSIS — K643 Fourth degree hemorrhoids: Secondary | ICD-10-CM | POA: Diagnosis not present

## 2020-02-27 DIAGNOSIS — L290 Pruritus ani: Secondary | ICD-10-CM | POA: Diagnosis not present

## 2020-02-27 DIAGNOSIS — K641 Second degree hemorrhoids: Secondary | ICD-10-CM | POA: Diagnosis not present

## 2020-02-27 DIAGNOSIS — K648 Other hemorrhoids: Secondary | ICD-10-CM | POA: Diagnosis not present

## 2020-02-27 NOTE — H&P (Signed)
Darren Sweeney Appointment: 02/27/2020 2:30 PM Location: [facility] Patient #: [id number] DOB: [birth year] Married / Language: Darren Sweeney / Race: White Male  History of Present Illness Darren Hector MD; 02/27/2020 5:04 PM) The patient is a 49 year old male who presents with hemorrhoids. Note for "Hemorrhoids": ` ` ` Patient sent for surgical consultation at the request of Darren Sweeney GI  Chief Complaint: Rectal bleeding. Hemorrhoids. ` ` The patient is a pleasant morbidly obese male. He insisted on his wife being in the room. He stroke with hemorrhoid a issues more recently. He is to move his bowels maybe once or twice a day. Darren Sweeney had some episodes of loose bowel movements sometimes up to 6 times a day. Noted some bleeding and pain with hard bowel movements in the past. Feels like something is out all the time. At some over-the-counter Preparation H. Helps a little bit. However he feels that hemorrhoid is out all the time now with some itching and irritating and bleeding. Patient had upper and lower endoscopy that noted some benign gastric polyps as well as a few hyperplastic and tubular adenomatous colon polyps. Rather small. Mild gastritis. No other major colon problems but Enlarged hemorrhoids noted. Surgical consultation offered. He does smoke about a pack a day. Sleep apnea using CPAP. No prior abdominal or anorectal surgeries. No prior banding's. No urinary tract infections. He is rather hard of hearing to do an anterior injury. 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  No personal nor family history of GI/colon cancer, inflammatory bowel disease, irritable bowel syndrome, allergy such as Celiac Sprue, dietary/dairy problems, colitis, ulcers nor gastritis. No recent sick contacts/gastroenteritis. No travel outside the country. No changes in diet. No dysphagia to solids or liquids. No significant  heartburn or reflux. No melena, hematemesis, coffee ground emesis. No evidence of prior gastric/peptic ulceration.  (Review of systems as stated in this history (HPI) or in the review of systems. Otherwise all other 12 point ROS are negative) ` ` ###########################################`  This patient encounter took 35 minutes today to perform the following: obtain history, perform exam, review outside records, interpret tests & imaging, counsel the patient on their diagnosis; and, document this encounter, including findings & plan in the electronic health record (EHR).   Past Surgical History Darden Palmer, [place]; 02/27/2020 2:03 PM) Colon Polyp Removal - Colonoscopy Shoulder Surgery Right.  Diagnostic Studies History Darden Palmer, [place]; 02/27/2020 2:03 PM) Colonoscopy within last year  Allergies Darden Palmer, RMA; 02/27/2020 2:04 PM) No Known Drug Allergies [02/27/2020]: Allergies Reconciled  Medication History Darden Palmer, [place]; 02/27/2020 2:05 PM) Hyoscyamine Sulfate (0.125MG  Tab Sublingual, Sublingual) Active. Etodolac ER (500MG  Tablet ER 24HR, Oral) Active. Ezetimibe (10MG  Tablet, Oral) Active. Fenofibrate Micronized (200MG  Capsule, Oral) Active. Losartan Potassium (25MG  Tablet, Oral) Active. metFORMIN HCl (1000MG  Tablet, Oral) Active. Atorvastatin Calcium (10MG  Tablet, Oral) Active. Diclofenac Sodium (75MG  Tablet DR, Oral) Active. Medications Reconciled  Social History Darden Palmer, [place]; 02/27/2020 2:03 PM) Caffeine use Carbonated beverages. Tobacco use Current every day smoker.  Family History Darden Palmer, [place]; 02/27/2020 2:03 PM) Cancer Brother. Cerebrovascular Accident Brother, Father, Sister. Diabetes Mellitus Brother, Sister. Heart Disease Father, Mother. Heart disease in male family member before age 33 Hypertension Father, Mother. Respiratory Condition Father.  Other Problems Darden Palmer, [place]; 02/27/2020 2:03  PM) Diabetes Mellitus Hemorrhoids Hypercholesterolemia Sleep Apnea     Review of Systems Darden Palmer RMA; 02/27/2020 2:03 PM) General Not Present- Appetite Loss,  Chills, Fatigue, Fever, Night Sweats, Weight Gain and Weight Loss. Skin Not Present- Change in Wart/Mole, Dryness, Hives, Jaundice, New Lesions, Non-Healing Wounds, Rash and Ulcer. HEENT Present- Hearing Loss. Not Present- Earache, Hoarseness, Nose Bleed, Oral Ulcers, Ringing in the Ears, Seasonal Allergies, Sinus Pain, Sore Throat, Visual Disturbances, Wears glasses/contact lenses and Yellow Eyes. Breast Not Present- Breast Mass, Breast Pain, Nipple Discharge and Skin Changes. Cardiovascular Present- Leg Cramps. Not Present- Chest Pain, Difficulty Breathing Lying Down, Palpitations, Rapid Heart Rate, Shortness of Breath and Swelling of Extremities. Gastrointestinal Present- Abdominal Pain, Bloody Stool, Hemorrhoids and Rectal Pain. Not Present- Bloating, Change in Bowel Habits, Chronic diarrhea, Constipation, Difficulty Swallowing, Excessive gas, Gets full quickly at meals, Indigestion, Nausea and Vomiting. Male Genitourinary Not Present- Blood in Urine, Change in Urinary Stream, Frequency, Impotence, Nocturia, Painful Urination, Urgency and Urine Leakage.  Vitals Lattie Haw Caldwell RMA; 02/27/2020 2:06 PM) 02/27/2020 2:05 PM Weight: 374.13 lb Temp.: 98.34F  Pulse: 128 (Regular)  P.OX: 94% (Room air) BP: 142/86(Sitting, Left Arm, Standard)        Physical Exam Darren Hector MD; 02/27/2020 2:58 PM)  General Mental Status-Alert. General Appearance-Not in acute distress, Not Sickly. Orientation-Oriented X3. Hydration-Well hydrated. Voice-Normal.  Integumentary Global Assessment Upon inspection and palpation of skin surfaces of the - Axillae: non-tender, no inflammation or ulceration, no drainage. and Distribution of scalp and body hair is normal. General Characteristics Temperature - normal  warmth is noted.  Head and Neck Head-normocephalic, atraumatic with no lesions or palpable masses. Face Global Assessment - atraumatic, no absence of expression. Neck Global Assessment - no abnormal movements, no bruit auscultated on the right, no bruit auscultated on the left, no decreased range of motion, non-tender. Trachea-midline. Thyroid Gland Characteristics - non-tender.  Eye Eyeball - Left-Extraocular movements intact, No Nystagmus - Left. Eyeball - Right-Extraocular movements intact, No Nystagmus - Right. Cornea - Left-No Hazy - Left. Cornea - Right-No Hazy - Right. Sclera/Conjunctiva - Left-No scleral icterus, No Discharge - Left. Sclera/Conjunctiva - Right-No scleral icterus, No Discharge - Right. Pupil - Left-Direct reaction to light normal. Pupil - Right-Direct reaction to light normal.  ENMT Ears Pinna - Left - no drainage observed, no generalized tenderness observed. Pinna - Right - no drainage observed, no generalized tenderness observed. Nose and Sinuses External Inspection of the Nose - no destructive lesion observed. Inspection of the nares - Left - quiet respiration. Inspection of the nares - Right - quiet respiration. Mouth and Throat Lips - Upper Lip - no fissures observed, no pallor noted. Lower Lip - no fissures observed, no pallor noted. Nasopharynx - no discharge present. Oral Cavity/Oropharynx - Tongue - no dryness observed. Oral Mucosa - no cyanosis observed. Hypopharynx - no evidence of airway distress observed.  Chest and Lung Exam Inspection Movements - Normal and Symmetrical. Accessory muscles - No use of accessory muscles in breathing. Palpation Palpation of the chest reveals - Non-tender. Auscultation Breath sounds - Normal and Clear.  Cardiovascular Auscultation Rhythm - Regular. Murmurs & Other Heart Sounds - Auscultation of the heart reveals - No Murmurs and No Systolic Clicks.  Abdomen Inspection Inspection of the  abdomen reveals - No Visible peristalsis and No Abnormal pulsations. Umbilicus - No Bleeding, No Urine drainage. Palpation/Percussion Palpation and Percussion of the abdomen reveal - Soft, Non Tender, No Rebound tenderness, No Rigidity (guarding) and No Cutaneous hyperesthesia. Note: Abdomen soft. Nontender. Not distended. No umbilical or incisional hernias. No guarding.  Male Genitourinary Sexual Maturity Tanner 5 - Adult hair pattern and Adult  penile size and shape.  Rectal Note: Large right posterior grade 4 prolapsed hemorrhoid. Left lateral perianal pruritus.  Perianal skin clean with good hygiene. No pilonidal disease. No fissure. No abscess/fistula. Normal sphincter tone. No condyloma warts.  Tolerates digital and anoscopic rectal exam. No rectal masses. Hemorrhoidal piles normal. Exam done with assistance of male Medical Assistant in the room.  Peripheral Vascular Upper Extremity Inspection - Left - No Cyanotic nailbeds - Left, Not Ischemic. Inspection - Right - No Cyanotic nailbeds - Right, Not Ischemic.  Neurologic Neurologic evaluation reveals -normal attention span and ability to concentrate, able to name objects and repeat phrases. Appropriate fund of knowledge , normal sensation and normal coordination. Mental Status Affect - not angry, not paranoid. Cranial Nerves-Normal Bilaterally. Gait-Normal.  Neuropsychiatric Mental status exam performed with findings of-able to articulate well with normal speech/language, rate, volume and coherence, thought content normal with ability to perform basic computations and apply abstract reasoning and no evidence of hallucinations, delusions, obsessions or homicidal/suicidal ideation.  Musculoskeletal Global Assessment Spine, Ribs and Pelvis - no instability, subluxation or laxity. Right Upper Extremity - no instability, subluxation or laxity.  Lymphatic Head & Neck  General Head & Neck Lymphatics:  Bilateral - Description - No Localized lymphadenopathy. Axillary  General Axillary Region: Bilateral - Description - No Localized lymphadenopathy. Femoral & Inguinal  Generalized Femoral & Inguinal Lymphatics: Left - Description - No Localized lymphadenopathy. Right - Description - No Localized lymphadenopathy.   Results Darren Hector MD; 02/27/2020 3:10 PM) Procedures  Name Value Date Hemorrhoids Procedure Anal exam: External Hemorrhoid Internal exam: Internal Hemorrhoids (bleeding) Internal Hemorroids ( non-bleeding) prolapse Other: Large right posterior grade 4 prolapsed hemorrhoid. Left lateral perianal pruritus............Marland KitchenPerianal skin clean with good hygiene. No pilonidal disease. No fissure. No abscess/fistula. Normal sphincter tone. No condyloma warts............Marland KitchenTolerates digital and anoscopic rectal exam. No rectal masses. Hemorrhoidal piles normal......Marland KitchenExam done with assistance of male Medical Assistant in the room.  Performed: 02/27/2020 2:59 PM    Assessment & Plan Darren Hector MD; 02/27/2020 3:10 PM)  PROLAPSED INTERNAL HEMORRHOIDS, GRADE 4 (K64.3) Impression: Irritated internal hemorrhoids with bleeding with right posterior pile chronically prolapsed. I think he would benefit from surgery. Outpatient hemorrhoidal ligation and pexy. Hemorrhoidectomy of remaining tissue.  Hopefully he can ride this out until he can take time off during the winter holidays when its less disruptive. He is hoping to plan for that. We'll work to coordinate convenient times.  The anatomy & physiology of the anorectal region was discussed. The pathophysiology of hemorrhoids and differential diagnosis was discussed. Natural history progression was discussed. I stressed the importance of a bowel regimen to have daily soft bowel movements to minimize progression of disease. Goal of one BM / day ideal. Use of wet wipes, warm baths, avoiding straining,  etc were emphasized.  Educational handouts further explaining the pathology, treatment options, and bowel regimen were given as well. The patient expressed understanding.   PROLAPSED INTERNAL HEMORRHOIDS, GRADE 2 (K64.1)   INTERNAL BLEEDING HEMORRHOIDS (K64.8)  Current Plans Pt Education - [facility] Hemorrhoids (Azora Bonzo): discussed with patient and provided information. Pt Education - Pamphlet Given - The Hemorrhoid Book: discussed with patient and provided information. You are being scheduled for surgery- Our schedulers will call you.  You should hear from our office's scheduling department within 5 working days about the location, date, and time of surgery. We try to make accommodations for patient's preferences in scheduling surgery, but sometimes the OR schedule or the surgeon's schedule prevents [place] from  making those accommodations.  If you have not heard from our office [phone]) in 5 working days, call the office and ask for your surgeon's nurse.  If you have other questions about your diagnosis, plan, or surgery, call the office and ask for your surgeon's nurse.  The anatomy & physiology of the anorectal region was discussed. The pathophysiology of hemorrhoids and differential diagnosis was discussed. Natural history risks without surgery was discussed. I stressed the importance of a bowel regimen to have daily soft bowel movements to minimize progression of disease. Interventions such as sclerotherapy & banding were discussed.  The patient's symptoms are not adequately controlled by medicines and other non-operative treatments. I feel the risks & problems of no surgery outweigh the operative risks; therefore, I recommended surgery to treat the hemorrhoids by ligation, pexy, and possible resection.  Risks such as bleeding, infection, urinary difficulties, need for further treatment, heart attack, death, and other risks were discussed. I noted a good likelihood this will help  address the problem. Goals of post-operative recovery were discussed as well. Possibility that this will not correct all symptoms was explained. Post-operative pain, bleeding, constipation, and other problems after surgery were discussed. We will work to minimize complications. Educational handouts further explaining the pathology, treatment options, and bowel regimen were given as well. Questions were answered. The patient expresses understanding & wishes to proceed with surgery.  ANOSCOPY, DIAGNOSTIC (46600)  ENCOUNTER FOR PREOPERATIVE EXAMINATION FOR GENERAL SURGICAL PROCEDURE (Z01.818)  Current Plans Pt Education - [facility] Rectal Prep for Anorectal outpatient/office surgery: discussed with patient and provided information. Pt Education - [facility] Rectal Surgery HCI (Jazlynne Milliner): discussed with patient and provided information.  PRURITUS ANI (L29.0) Impression: Some irritation pruritus most likely due to his diarrhea and prolapsed hemorrhoid. Consider trying to keep the area clean and dry. Try cotton balls or powders to help dry the skin & keep it from getting more irritated.  Current Plans Pt Education - [facility] Pruritus Ani (AT)  Darren Hector, MD, FACS, MASCRS Gastrointestinal and Minimally Invasive Surgery  [facility] [address] [place], [place] [address] [phone] Fax [phone] Main/Paging  CONTACT INFORMATION: Weekday (9AM-5PM) concerns: Call [facility] main office at [phone] Weeknight (5PM-9AM) or Weekend/Holiday concerns: Check [email or web address] for General Surgery [facility] coverage (Please, do not use SecureChat as it is not reliable communication to operating surgeons for immediate patient care)

## 2020-02-28 DIAGNOSIS — Z20822 Contact with and (suspected) exposure to covid-19: Secondary | ICD-10-CM | POA: Diagnosis not present

## 2020-03-06 DIAGNOSIS — Z20822 Contact with and (suspected) exposure to covid-19: Secondary | ICD-10-CM | POA: Diagnosis not present

## 2020-03-23 DIAGNOSIS — Z20822 Contact with and (suspected) exposure to covid-19: Secondary | ICD-10-CM | POA: Diagnosis not present

## 2020-03-27 DIAGNOSIS — Z20822 Contact with and (suspected) exposure to covid-19: Secondary | ICD-10-CM | POA: Diagnosis not present

## 2020-03-31 DIAGNOSIS — M0579 Rheumatoid arthritis with rheumatoid factor of multiple sites without organ or systems involvement: Secondary | ICD-10-CM | POA: Diagnosis not present

## 2020-03-31 DIAGNOSIS — Z79899 Other long term (current) drug therapy: Secondary | ICD-10-CM | POA: Diagnosis not present

## 2020-05-05 NOTE — Progress Notes (Signed)
Reviewing pt chart for pre-op interview.  Noted in dr gross h&p the pt is 374 lb, 5'10' w/ BMI 53.66.  This over the anesthesia guidelines for ambulatory surgery.  Called and left message to OR scheduler, Cipriano Mile, inform of this and that will need be moved to main OR.

## 2020-05-06 ENCOUNTER — Encounter (HOSPITAL_COMMUNITY): Payer: Self-pay | Admitting: Surgery

## 2020-05-06 ENCOUNTER — Other Ambulatory Visit: Payer: Self-pay

## 2020-05-06 ENCOUNTER — Inpatient Hospital Stay (HOSPITAL_COMMUNITY): Admission: RE | Admit: 2020-05-06 | Payer: BC Managed Care – PPO | Source: Ambulatory Visit

## 2020-05-06 DIAGNOSIS — Z01812 Encounter for preprocedural laboratory examination: Secondary | ICD-10-CM | POA: Diagnosis present

## 2020-05-06 DIAGNOSIS — Z20822 Contact with and (suspected) exposure to covid-19: Secondary | ICD-10-CM | POA: Diagnosis not present

## 2020-05-06 DIAGNOSIS — Z01818 Encounter for other preprocedural examination: Secondary | ICD-10-CM | POA: Diagnosis not present

## 2020-05-06 NOTE — Progress Notes (Signed)
COVID Vaccine Completed: no Date COVID Vaccine completed:  N/A COVID vaccine manufacturer:  N/A   PCP - [facility] Cardiologist -  N/A  Chest x-ray - greater than  1year EKG -  Stress Test -  N/A ECHO -  N/A Cardiac Cath -  N/A Pacemaker/ICD device last checked: N/A  Sleep Study - Yes CPAP - Yes  Fasting Blood Sugar - 170-180's Checks Blood Sugar __1___ times a day  Blood Thinner Instructions: N/A Aspirin Instructions: N/A Last Dose: N/A  Activity level:  Can go up a flight of stairs without stopping and without symptoms    Anesthesia review: OSA, HTN,, DM, BMI 53.81  Patient denies shortness of breath, fever, cough and chest pain at PAT appointment   Patient verbalized understanding of instructions that were given to them at the PAT appointment. Patient was also instructed that they will need to review over the PAT instructions again at home before surgery.

## 2020-05-06 NOTE — Patient Instructions (Signed)
DUE TO COVID-19 ONLY ONE VISITOR IS ALLOWED TO COME WITH YOU AND STAY IN THE WAITING ROOM ONLY DURING PRE OP AND PROCEDURE.    COVID SWAB TESTING MUST BE COMPLETED ON:  Today, at 3:10PM   [address] [place], [place] [address]  (Must self quarantine after testing. Follow instructions on handout.)       Your procedure is scheduled on: Monday, Dec. 27, 2021   Report to [facility] Main  Entrance    Report to admitting at 8:30 AM   Call this number if you have problems the morning of surgery [phone]   Drink 2 Ensure drinks the night before surgery at 10:00 PM   Do not eat food :After Midnight.   May have liquids until 7:30AM    day of surgery  CLEAR LIQUID DIET  Foods Allowed                                                                     Foods Excluded  Water, Black Coffee and tea, regular and decaf                             liquids that you cannot  Plain Jell-O in any flavor  (No red)                                           see through such as: Fruit ices (not with fruit pulp)                                     milk, soups, orange juice              Iced Popsicles (No red)                                    All solid food                                   Apple juices Sports drinks like Gatorade (No red) Lightly seasoned clear broth or consume(fat free) Sugar, honey syrup  Sample Menu Breakfast                                Lunch                                     Supper Cranberry juice                    Beef broth                            Chicken broth Jell-O  Grape juice                           Apple juice Coffee or tea                        Jell-O                                      Popsicle                                                Coffee or tea                        Coffee or tea       Complete one g2 drink the morning of surgery at 7:30AM.   Oral Hygiene is also important to reduce your risk of  infection.                                    Remember - BRUSH YOUR TEETH THE MORNING OF SURGERY WITH YOUR REGULAR TOOTHPASTE   Do NOT smoke after Midnight   Take these medicines the morning of surgery with A SIP OF WATER: Atorvastatin, Ezetimibe, Fenofibrate, Omeprazole  DO NOT TAKE ANY ORAL DIABETIC MEDICATIONS DAY OF YOUR SURGERY  Bring CPAP mask and tubing the day of surgery                               You may not have any metal on your body including jewelry, and body piercings             Do not wear lotions, powders, perfumes/cologne, or deodorant                          Men may shave face and neck.   Do not bring valuables to the hospital. [place].   Contacts, dentures or bridgework may not be worn into surgery.    Patients discharged the day of surgery will not be allowed to drive home.   Special Instructions: Bring a copy of your healthcare power of attorney and living will documents         the day of surgery if you haven't scanned them in before.              Please read over the following fact sheets you were given: IF YOU HAVE QUESTIONS ABOUT YOUR PRE OP [place] [phone]  How to Manage Your Diabetes Before and After Surgery  Why is it important to control my blood sugar before and after surgery? . Improving blood sugar levels before and after surgery helps healing and can limit problems. . A way of improving blood sugar control is eating a healthy diet by: o  Eating less sugar and carbohydrates o  Increasing activity/exercise o  Talking with your doctor about reaching your blood sugar goals . High blood sugars (  greater than 180 mg/dL) can raise your risk of infections and slow your recovery, so you will need to focus on controlling your diabetes during the weeks before surgery. . Make sure that the doctor who takes care of your diabetes knows about your planned surgery including the date and  location.  How do I manage my blood sugar before surgery? . Check your blood sugar at least 4 times a day, starting 2 days before surgery, to make sure that the level is not too high or low. o Check your blood sugar the morning of your surgery when you wake up and every 2 hours until you get to the Short Stay unit. . If your blood sugar is less than 70 mg/dL, you will need to treat for low blood sugar: o Do not take insulin. o Treat a low blood sugar (less than 70 mg/dL) with  cup of clear juice (cranberry or apple), 4 glucose tablets, OR glucose gel. o Recheck blood sugar in 15 minutes after treatment (to make sure it is greater than 70 mg/dL). If your blood sugar is not greater than 70 mg/dL on recheck, call [phone] for further instructions. . Report your blood sugar to the short stay nurse when you get to Short Stay.  . If you are admitted to the hospital after surgery: o Your blood sugar will be checked by the staff and you will probably be given insulin after surgery (instead of oral diabetes medicines) to make sure you have good blood sugar levels. o The goal for blood sugar control after surgery is 80-180 mg/dL.   WHAT DO I DO ABOUT MY DIABETES MEDICATION?  Marland Kitchen Do not take oral diabetes medicines (pills) the morning of surgery.   Reviewed and Endorsed by [facility] Patient Education Committee, August 2015  [facility] - Preparing for Surgery Before surgery, you can play an important role.  Because skin is not sterile, your skin needs to be as free of germs as possible.  You can reduce the number of germs on your skin by washing with CHG (chlorahexidine gluconate) soap before surgery.  CHG is an antiseptic cleaner which kills germs and bonds with the skin to continue killing germs even after washing. Please DO NOT use if you have an allergy to CHG or antibacterial soaps.  If your skin becomes reddened/irritated stop using the CHG and inform your nurse when you arrive at Short  Stay. Do not shave (including legs and underarms) for at least 48 hours prior to the first CHG shower.  You may shave your face/neck.  Please follow these instructions carefully:  1.  Shower with CHG Soap the night before surgery and the  morning of surgery.  2.  If you choose to wash your hair, wash your hair first as usual with your normal  shampoo.  3.  After you shampoo, rinse your hair and body thoroughly to remove the shampoo.                             4.  Use CHG as you would any other liquid soap.  You can apply chg directly to the skin and wash.  Gently with a scrungie or clean washcloth.  5.  Apply the CHG Soap to your body ONLY FROM THE NECK DOWN.   Do   not use on face/ open  Wound or open sores. Avoid contact with eyes, ears mouth and   genitals (private parts).                       Wash face,  Genitals (private parts) with your normal soap.             6.  Wash thoroughly, paying special attention to the area where your    surgery  will be performed.  7.  Thoroughly rinse your body with warm water from the neck down.  8.  DO NOT shower/wash with your normal soap after using and rinsing off the CHG Soap.                9.  Pat yourself dry with a clean towel.            10.  Wear clean pajamas.            11.  Place clean sheets on your bed the night of your first shower and do not  sleep with pets. Day of Surgery : Do not apply any lotions/deodorants the morning of surgery.  Please wear clean clothes to the hospital/surgery center.  FAILURE TO FOLLOW THESE INSTRUCTIONS MAY RESULT IN THE CANCELLATION OF YOUR SURGERY  PATIENT SIGNATURE_________________________________  NURSE SIGNATURE__________________________________  ________________________________________________________________________

## 2020-05-07 ENCOUNTER — Encounter (HOSPITAL_COMMUNITY)
Admission: RE | Admit: 2020-05-07 | Discharge: 2020-05-07 | Disposition: A | Discharge status: Home / Self Care | Payer: BC Managed Care – PPO | Source: Ambulatory Visit | Attending: Surgery | Admitting: Surgery

## 2020-05-07 ENCOUNTER — Inpatient Hospital Stay (HOSPITAL_COMMUNITY): Admission: RE | Admit: 2020-05-07 | Payer: BC Managed Care – PPO | Source: Ambulatory Visit

## 2020-05-07 ENCOUNTER — Other Ambulatory Visit (HOSPITAL_COMMUNITY)
Admission: RE | Admit: 2020-05-07 | Discharge: 2020-05-07 | Disposition: A | Discharge status: Home / Self Care | Payer: BC Managed Care – PPO | Source: Ambulatory Visit | Attending: Surgery | Admitting: Surgery

## 2020-05-07 DIAGNOSIS — Z01818 Encounter for other preprocedural examination: Secondary | ICD-10-CM | POA: Insufficient documentation

## 2020-05-07 DIAGNOSIS — Z20822 Contact with and (suspected) exposure to covid-19: Secondary | ICD-10-CM | POA: Insufficient documentation

## 2020-05-07 DIAGNOSIS — Z01812 Encounter for preprocedural laboratory examination: Secondary | ICD-10-CM | POA: Diagnosis not present

## 2020-05-07 LAB — BASIC METABOLIC PANEL
Anion gap: 10 (ref 5–15)
BUN: 13 mg/dL (ref 6–20)
CO2: 26 mmol/L (ref 22–32)
Calcium: 9.2 mg/dL (ref 8.9–10.3)
Chloride: 101 mmol/L (ref 98–111)
Creatinine, Ser: 0.85 mg/dL (ref 0.61–1.24)
GFR, Estimated: >60 mL/min (ref >60)
Glucose, Bld: 217 mg/dL — ABNORMAL HIGH (ref 70–99)
Potassium: 4 mmol/L (ref 3.5–5.1)
Sodium: 137 mmol/L (ref 135–145)

## 2020-05-07 LAB — HEMOGLOBIN A1C
Hgb A1c MFr Bld: 8.7 % — ABNORMAL HIGH (ref 4.8–5.6)
Mean Plasma Glucose: 202.99 mg/dL

## 2020-05-07 LAB — CBC
HCT: 47.2 % (ref 39.0–52.0)
Hemoglobin: 15.3 g/dL (ref 13.0–17.0)
MCH: 27.7 pg (ref 26.0–34.0)
MCHC: 32.4 g/dL (ref 30.0–36.0)
MCV: 85.4 fL (ref 80.0–100.0)
Platelets: 238 10*3/uL (ref 150–400)
RBC: 5.53 MIL/uL (ref 4.22–5.81)
RDW: 13.2 % (ref 11.5–15.5)
WBC: 8.9 10*3/uL (ref 4.0–10.5)
nRBC: 0 % (ref 0.0–0.2)

## 2020-05-07 LAB — SARS CORONAVIRUS 2 (TAT 6-24 HRS): SARS Coronavirus 2: NEGATIVE

## 2020-05-07 LAB — GLUCOSE, CAPILLARY: Glucose-Capillary: 228 mg/dL — ABNORMAL HIGH (ref 70–99)

## 2020-05-07 NOTE — Progress Notes (Signed)
Dr. Lanetta Inch made aware of Hgb A1c 8.7 on 05/07/20.

## 2020-05-10 MED ORDER — BUPIVACAINE LIPOSOME 1.3 % IJ SUSP
20.0000 mL | Freq: Once | INTRAMUSCULAR | Status: DC
  Filled 2020-05-10: qty 20

## 2020-05-11 ENCOUNTER — Encounter (HOSPITAL_COMMUNITY): Payer: Self-pay | Admitting: Surgery

## 2020-05-11 ENCOUNTER — Ambulatory Visit (HOSPITAL_COMMUNITY): Payer: BC Managed Care – PPO | Admitting: Physician Assistant

## 2020-05-11 ENCOUNTER — Encounter (HOSPITAL_COMMUNITY)
Admission: RE | Disposition: A | Discharge status: Home / Self Care | Payer: Self-pay | Source: Home / Self Care | Attending: Surgery

## 2020-05-11 ENCOUNTER — Ambulatory Visit (HOSPITAL_COMMUNITY)
Admission: RE | Admit: 2020-05-11 | Discharge: 2020-05-11 | Disposition: A | Discharge status: Home / Self Care | Payer: BC Managed Care – PPO | Attending: Surgery | Admitting: Surgery

## 2020-05-11 DIAGNOSIS — K649 Unspecified hemorrhoids: Secondary | ICD-10-CM | POA: Diagnosis not present

## 2020-05-11 DIAGNOSIS — Z79899 Other long term (current) drug therapy: Secondary | ICD-10-CM | POA: Diagnosis not present

## 2020-05-11 DIAGNOSIS — Z7984 Long term (current) use of oral hypoglycemic drugs: Secondary | ICD-10-CM | POA: Insufficient documentation

## 2020-05-11 DIAGNOSIS — K643 Fourth degree hemorrhoids: Secondary | ICD-10-CM | POA: Diagnosis not present

## 2020-05-11 DIAGNOSIS — I10 Essential (primary) hypertension: Secondary | ICD-10-CM | POA: Diagnosis not present

## 2020-05-11 DIAGNOSIS — F17210 Nicotine dependence, cigarettes, uncomplicated: Secondary | ICD-10-CM | POA: Diagnosis not present

## 2020-05-11 DIAGNOSIS — E119 Type 2 diabetes mellitus without complications: Secondary | ICD-10-CM | POA: Diagnosis not present

## 2020-05-11 DIAGNOSIS — K644 Residual hemorrhoidal skin tags: Secondary | ICD-10-CM | POA: Diagnosis not present

## 2020-05-11 DIAGNOSIS — K648 Other hemorrhoids: Secondary | ICD-10-CM | POA: Diagnosis not present

## 2020-05-11 HISTORY — DX: Unspecified hearing loss, left ear: H91.92

## 2020-05-11 HISTORY — PX: EVALUATION UNDER ANESTHESIA WITH HEMORRHOIDECTOMY: SHX5624

## 2020-05-11 HISTORY — DX: Rheumatoid arthritis, unspecified: M06.9

## 2020-05-11 LAB — GLUCOSE, CAPILLARY
Glucose-Capillary: 190 mg/dL — ABNORMAL HIGH (ref 70–99)
Glucose-Capillary: 180 mg/dL — ABNORMAL HIGH (ref 70–99)

## 2020-05-11 SURGERY — EXAM UNDER ANESTHESIA WITH HEMORRHOIDECTOMY
Anesthesia: General | Site: Rectum

## 2020-05-11 MED ORDER — FENTANYL CITRATE (PF) 100 MCG/2ML IJ SOLN: 25.0000 ug | INTRAMUSCULAR | Status: DC | PRN

## 2020-05-11 MED ORDER — 0.9 % SODIUM CHLORIDE (POUR BTL) OPTIME
TOPICAL | Status: DC | PRN
  Administered 2020-05-11: 1000 mL

## 2020-05-11 MED ORDER — DIBUCAINE (PERIANAL) 1 % EX OINT
TOPICAL_OINTMENT | CUTANEOUS | Status: AC
  Filled 2020-05-11: qty 28

## 2020-05-11 MED ORDER — DEXAMETHASONE SODIUM PHOSPHATE 10 MG/ML IJ SOLN
INTRAMUSCULAR | Status: AC
  Filled 2020-05-11: qty 1

## 2020-05-11 MED ORDER — ROCURONIUM BROMIDE 10 MG/ML (PF) SYRINGE
PREFILLED_SYRINGE | INTRAVENOUS | Status: AC
  Filled 2020-05-11: qty 10

## 2020-05-11 MED ORDER — ONDANSETRON HCL 4 MG/2ML IJ SOLN
INTRAMUSCULAR | Status: DC | PRN
  Administered 2020-05-11: 4 mg via INTRAVENOUS

## 2020-05-11 MED ORDER — BUPIVACAINE-EPINEPHRINE (PF) 0.25% -1:200000 IJ SOLN
INTRAMUSCULAR | Status: AC
  Filled 2020-05-11: qty 30

## 2020-05-11 MED ORDER — ONDANSETRON HCL 4 MG/2ML IJ SOLN
INTRAMUSCULAR | Status: AC
  Filled 2020-05-11: qty 2

## 2020-05-11 MED ORDER — LIDOCAINE HCL (PF) 2 % IJ SOLN
INTRAMUSCULAR | Status: AC
  Filled 2020-05-11: qty 5

## 2020-05-11 MED ORDER — METRONIDAZOLE IN NACL 5-0.79 MG/ML-% IV SOLN
500.0000 mg | INTRAVENOUS | Status: AC
  Administered 2020-05-11: 500 mg via INTRAVENOUS
  Filled 2020-05-11: qty 100

## 2020-05-11 MED ORDER — MIDAZOLAM HCL 2 MG/2ML IJ SOLN
INTRAMUSCULAR | Status: AC
  Filled 2020-05-11: qty 2

## 2020-05-11 MED ORDER — ROCURONIUM BROMIDE 100 MG/10ML IV SOLN
INTRAVENOUS | Status: DC | PRN
  Administered 2020-05-11: 20 mg via INTRAVENOUS
  Administered 2020-05-11: 10 mg via INTRAVENOUS
  Administered 2020-05-11: 50 mg via INTRAVENOUS
  Administered 2020-05-11: 10 mg via INTRAVENOUS

## 2020-05-11 MED ORDER — PHENYLEPHRINE 40 MCG/ML (10ML) SYRINGE FOR IV PUSH (FOR BLOOD PRESSURE SUPPORT)
PREFILLED_SYRINGE | INTRAVENOUS | Status: DC | PRN
  Administered 2020-05-11: 200 ug via INTRAVENOUS

## 2020-05-11 MED ORDER — DEXAMETHASONE SODIUM PHOSPHATE 10 MG/ML IJ SOLN
INTRAMUSCULAR | Status: DC | PRN
  Administered 2020-05-11: 5 mg via INTRAVENOUS

## 2020-05-11 MED ORDER — ACETAMINOPHEN 500 MG PO TABS
1000.0000 mg | ORAL_TABLET | ORAL | Status: AC
  Administered 2020-05-11: 1000 mg via ORAL
  Filled 2020-05-11: qty 2

## 2020-05-11 MED ORDER — GABAPENTIN 300 MG PO CAPS
300.0000 mg | ORAL_CAPSULE | ORAL | Status: AC
  Administered 2020-05-11: 300 mg via ORAL
  Filled 2020-05-11: qty 1

## 2020-05-11 MED ORDER — BUPIVACAINE LIPOSOME 1.3 % IJ SUSP
INTRAMUSCULAR | Status: DC | PRN
  Administered 2020-05-11: 20 mL

## 2020-05-11 MED ORDER — CHLORHEXIDINE GLUCONATE CLOTH 2 % EX PADS: 6.0000 | MEDICATED_PAD | Freq: Once | CUTANEOUS | Status: DC

## 2020-05-11 MED ORDER — SUGAMMADEX SODIUM 500 MG/5ML IV SOLN
INTRAVENOUS | Status: DC | PRN
  Administered 2020-05-11: 700 mg via INTRAVENOUS

## 2020-05-11 MED ORDER — OXYCODONE HCL 5 MG PO TABS: 5.0000 mg | ORAL_TABLET | Freq: Four times a day (QID) | ORAL | 0 refills | Status: DC | PRN

## 2020-05-11 MED ORDER — PROPOFOL 10 MG/ML IV BOLUS
INTRAVENOUS | Status: DC | PRN
  Administered 2020-05-11: 150 mg via INTRAVENOUS

## 2020-05-11 MED ORDER — KETAMINE HCL 10 MG/ML IJ SOLN
INTRAMUSCULAR | Status: AC
  Filled 2020-05-11: qty 1

## 2020-05-11 MED ORDER — FENTANYL CITRATE (PF) 250 MCG/5ML IJ SOLN
INTRAMUSCULAR | Status: AC
  Filled 2020-05-11: qty 5

## 2020-05-11 MED ORDER — DIBUCAINE (PERIANAL) 1 % EX OINT
TOPICAL_OINTMENT | CUTANEOUS | Status: DC | PRN
  Administered 2020-05-11: 1 via RECTAL

## 2020-05-11 MED ORDER — SUCCINYLCHOLINE CHLORIDE 200 MG/10ML IV SOSY
PREFILLED_SYRINGE | INTRAVENOUS | Status: AC
  Filled 2020-05-11: qty 10

## 2020-05-11 MED ORDER — SUCCINYLCHOLINE CHLORIDE 200 MG/10ML IV SOSY
PREFILLED_SYRINGE | INTRAVENOUS | Status: DC | PRN
  Administered 2020-05-11: 200 mg via INTRAVENOUS

## 2020-05-11 MED ORDER — LIDOCAINE HCL (CARDIAC) PF 100 MG/5ML IV SOSY
PREFILLED_SYRINGE | INTRAVENOUS | Status: DC | PRN
  Administered 2020-05-11: 100 mg via INTRAVENOUS

## 2020-05-11 MED ORDER — SODIUM CHLORIDE 0.9 % IV SOLN
2.0000 g | INTRAVENOUS | Status: AC
  Administered 2020-05-11: 2 g via INTRAVENOUS
  Filled 2020-05-11: qty 20

## 2020-05-11 MED ORDER — FENTANYL CITRATE (PF) 100 MCG/2ML IJ SOLN
INTRAMUSCULAR | Status: DC | PRN
  Administered 2020-05-11: 50 ug via INTRAVENOUS
  Administered 2020-05-11: 100 ug via INTRAVENOUS
  Administered 2020-05-11: 50 ug via INTRAVENOUS

## 2020-05-11 MED ORDER — ALBUTEROL SULFATE HFA 108 (90 BASE) MCG/ACT IN AERS
INHALATION_SPRAY | RESPIRATORY_TRACT | Status: DC | PRN
  Administered 2020-05-11: 8 via RESPIRATORY_TRACT

## 2020-05-11 MED ORDER — LACTATED RINGERS IV SOLN: INTRAVENOUS | Status: DC

## 2020-05-11 MED ORDER — MIDAZOLAM HCL 5 MG/5ML IJ SOLN
INTRAMUSCULAR | Status: DC | PRN
  Administered 2020-05-11: 2 mg via INTRAVENOUS

## 2020-05-11 MED ORDER — PROPOFOL 10 MG/ML IV BOLUS
INTRAVENOUS | Status: AC
  Filled 2020-05-11: qty 40

## 2020-05-11 MED ORDER — ENSURE PRE-SURGERY PO LIQD
296.0000 mL | Freq: Once | ORAL | Status: DC
  Filled 2020-05-11: qty 296

## 2020-05-11 MED ORDER — CHLORHEXIDINE GLUCONATE 0.12 % MT SOLN
15.0000 mL | Freq: Once | OROMUCOSAL | Status: AC
  Administered 2020-05-11: 15 mL via OROMUCOSAL

## 2020-05-11 MED ORDER — CELECOXIB 200 MG PO CAPS
200.0000 mg | ORAL_CAPSULE | ORAL | Status: AC
  Administered 2020-05-11: 200 mg via ORAL
  Filled 2020-05-11: qty 1

## 2020-05-11 MED ORDER — ORAL CARE MOUTH RINSE: 15.0000 mL | Freq: Once | OROMUCOSAL | Status: AC

## 2020-05-11 MED ORDER — BUPIVACAINE-EPINEPHRINE 0.25% -1:200000 IJ SOLN
INTRAMUSCULAR | Status: DC | PRN
  Administered 2020-05-11: 20 mL

## 2020-05-11 MED ORDER — KETAMINE HCL 10 MG/ML IJ SOLN
INTRAMUSCULAR | Status: DC | PRN
  Administered 2020-05-11: 70 mg via INTRAVENOUS

## 2020-05-11 SURGICAL SUPPLY — 36 items
APL SKNCLS STERI-STRIP NONHPOA (GAUZE/BANDAGES/DRESSINGS) ×1
BENZOIN TINCTURE PRP APPL 2/3 (GAUZE/BANDAGES/DRESSINGS) ×2 IMPLANT
BLADE SURG 15 STRL LF DISP TIS (BLADE) IMPLANT
BLADE SURG 15 STRL SS (BLADE)
BRIEF STRETCH FOR OB PAD LRG (UNDERPADS AND DIAPERS) ×2 IMPLANT
CNTNR URN SCR LID CUP LEK RST (MISCELLANEOUS) ×1 IMPLANT
CONT SPEC 4OZ STRL OR WHT (MISCELLANEOUS) ×2
COVER SURGICAL LIGHT HANDLE (MISCELLANEOUS) ×2 IMPLANT
COVER WAND RF STERILE (DRAPES) IMPLANT
DECANTER SPIKE VIAL GLASS SM (MISCELLANEOUS) ×2 IMPLANT
DRAPE LAPAROTOMY T 102X78X121 (DRAPES) ×2 IMPLANT
DRSG PAD ABDOMINAL 8X10 ST (GAUZE/BANDAGES/DRESSINGS) IMPLANT
ELECT PENCIL ROCKER SW 15FT (MISCELLANEOUS) ×1 IMPLANT
ELECT REM PT RETURN 15FT ADLT (MISCELLANEOUS) ×2 IMPLANT
GAUZE 4X4 16PLY RFD (DISPOSABLE) ×2 IMPLANT
GAUZE SPONGE 4X4 12PLY STRL (GAUZE/BANDAGES/DRESSINGS) ×1 IMPLANT
GLOVE ECLIPSE 8.0 STRL XLNG CF (GLOVE) ×2 IMPLANT
GLOVE INDICATOR 8.0 STRL GRN (GLOVE) ×2 IMPLANT
GOWN STRL REUS W/TWL XL LVL3 (GOWN DISPOSABLE) ×4 IMPLANT
KIT BASIN OR (CUSTOM PROCEDURE TRAY) ×2 IMPLANT
KIT TURNOVER KIT A (KITS) ×1 IMPLANT
LOOP VESSEL MAXI BLUE (MISCELLANEOUS) IMPLANT
NEEDLE HYPO 22GX1.5 SAFETY (NEEDLE) ×2 IMPLANT
PACK BASIC VI WITH GOWN DISP (CUSTOM PROCEDURE TRAY) ×2 IMPLANT
PENCIL SMOKE EVACUATOR (MISCELLANEOUS) IMPLANT
SHEARS HARMONIC 9CM CVD (BLADE) IMPLANT
SURGILUBE 2OZ TUBE FLIPTOP (MISCELLANEOUS) ×2 IMPLANT
SUT CHROMIC 2 0 SH (SUTURE) ×2 IMPLANT
SUT CHROMIC 3 0 SH 27 (SUTURE) IMPLANT
SUT VIC AB 2-0 SH 27 (SUTURE)
SUT VIC AB 2-0 SH 27X BRD (SUTURE) IMPLANT
SUT VIC AB 2-0 UR6 27 (SUTURE) ×12 IMPLANT
SYR 20ML LL LF (SYRINGE) ×2 IMPLANT
SYR 3ML LL SCALE MARK (SYRINGE) IMPLANT
TOWEL OR 17X26 10 PK STRL BLUE (TOWEL DISPOSABLE) ×2 IMPLANT
TOWEL OR NON WOVEN STRL DISP B (DISPOSABLE) ×2 IMPLANT

## 2020-05-11 NOTE — Op Note (Signed)
05/11/2020  11:43 AM  PATIENT:  Darren Sweeney  49 y.o. male  Patient Care Team: [facility] as PCP - General Kathi Der, MD as Consulting Physician (Gastroenterology) Karie Soda, MD as Consulting Physician (General Surgery) Marisue Brooklyn, MD as Referring Physician (Pulmonary Disease)  PRE-OPERATIVE DIAGNOSIS:  HEMORRHOIDS PROLAPSED GRADE 4 WITH BLEEDING AND PAIN  POST-OPERATIVE DIAGNOSIS:  HEMORRHOIDS PROLAPSED GRADE 4 WITH BLEEDING AND PAIN  PROCEDURE:    Internal and external hemorrhoidectomy x2  Internal hemorrhoid x1 Internal hemorrhoidal ligation and pexy Anorectal examination under anesthesia  SURGEON:  Ardeth Sportsman, MD  ANESTHESIA:   General Anorectal & Local field block (0.25% bupivacaine with epinephrine mixed with Liposomal bupivacaine (Experel)   EBL:  Total I/O In: -  Out: 50 [Blood:50].  See operative record  Delay start of Pharmacological VTE agent (>24hrs) due to surgical blood loss or risk of bleeding:  NO  DRAINS: NONE  SPECIMEN:   Internal & external hemorrhoid x3  DISPOSITION OF SPECIMEN:  PATHOLOGY  COUNTS:  YES  PLAN OF CARE: Discharge home after PACU  PATIENT DISPOSITION:  PACU - hemodynamically stable.  INDICATION: Pleasant patient with struggles with hemorrhoids.  Not able to be managed in the office despite an improved bowel regimen.  I recommended examination under anesthesia and surgical treatment:  The anatomy & physiology of the anorectal region was discussed.  The pathophysiology of hemorrhoids and differential diagnosis was discussed.  Natural history risks without surgery was discussed.   I stressed the importance of a bowel regimen to have daily soft bowel movements to minimize progression of disease.  Interventions such as sclerotherapy & banding were discussed.  The patient's symptoms are not adequately controlled by medicines and other non-operative treatments.  I feel the risks &  problems of no surgery outweigh the operative risks; therefore, I recommended surgery to treat the hemorrhoids by ligation, pexy, and possible resection.  Risks such as bleeding, infection, need for further treatment, heart attack, death, and other risks were discussed.   I noted a good likelihood this will help address the problem.  Goals of post-operative recovery were discussed as well.  Possibility that this will not correct all symptoms was explained.  Post-operative pain, bleeding, constipation, urinary difficulties, and other problems after surgery were discussed.  We will work to minimize complications.   Educational handouts further explaining the pathology, treatment options, and bowel regimen were given as well.  Questions were answered.  The patient expresses understanding & wishes to proceed with surgery.  OR FINDINGS: Very enlarged hemorrhoids.  Right posterior grade 4.  Right anterior smaller but still grade 4.  Left lateral at least grade 3.  Moderate perianal pruritus.  No fissure or fistula or abscess.  Irritated distal rectum most likely consistent from chronic prolapse and not any true proctitis.  Intact sphincter  DESCRIPTION:   Informed consent was confirmed. Patient underwent general anesthesia without difficulty. Patient was placed into prone positioning.  The perianal region was prepped and draped in sterile fashion. Surgical time-out confirmed our plan.  I did digital rectal examination and then transitioned over to anoscopy to get a sense of the anatomy.  Findings noted above.   I proceeded to do hemorrhoidal ligation and pexy.  I used a 2-0 Vicryl suture on a UR-6 needle in a figure-of-eight fashion 6 cm proximal to the anal verge.  I started at the largest hemorrhoid pile.  Because of redundant hemorrhoidal tissue too bulky to merely ligate or  pexy, I excised the excess internal hemorrhoid piles longitudinally in a fusiform biconcave fashion, sparing the anal canal to avoid  narrowing.  I then ran that stitch longitudinally more distally to close the hemorrhoidectomy wound to the anal verge over a large Parks self retaining retractor to avoid narrowing of the anal canal.  I then tied that stitch down to cause a hemorrhoidopexy.   I also had to do an excision at the  right anterior, right posterior and left lateral pile locations.  I then did hemorrhoidal ligation and pexy at the other 3 hemorrhoidal columns.  At the completion of this, all 6 anorectal columns were ligated and pexied in the classic hexagonal fashion (right anterior/lateral/posterior, left anterior/lateral/posterior).  I closed the external part of the hemorrhoidectomy wounds with interrupted horizontal mattress 2-0 chromic suture, leaving the last 5 mm open to allow natural drainage.    I redid anoscopy & examination.  At completion of this, all hemorrhoids had been removed or reduced into the rectum.  There is no more prolapse.  Internal & external anatomy was more more normal.  Hemostasis was good.  Fluffed gauze was on-laid over the perianal region.  No packing done.  Patient is being extubated go to go to the recovery room.  I had discussed postop care in detail with the patient in the preop holding area.  Instructions for post-operative recovery and prescriptions are written. I discussed operative findings, updated the patient's status, discussed probable steps to recovery, and gave postoperative recommendations to the patient's spouse.  Recommendations were made.  Questions were answered.  She expressed understanding & appreciation.  Ardeth Sportsman, M.D., F.A.C.S. Gastrointestinal and Minimally Invasive Surgery [facility] [address] [place], [place] [address] [phone] Main / Paging

## 2020-05-11 NOTE — Anesthesia Preprocedure Evaluation (Addendum)
Anesthesia Evaluation  Patient identified by MRN, date of birth, ID band Patient awake    Reviewed: Allergy & Precautions, NPO status , Patient's Chart, lab work & pertinent test results  Airway Mallampati: II  TM Distance: >3 FB Neck ROM: Full    Dental no notable dental hx. (+) Teeth Intact, Dental Advisory Given   Pulmonary sleep apnea and Continuous Positive Airway Pressure Ventilation , Current Smoker and Patient abstained from smoking.,    Pulmonary exam normal breath sounds clear to auscultation       Cardiovascular hypertension, Pt. on medications Normal cardiovascular exam Rhythm:Regular Rate:Normal     Neuro/Psych negative neurological ROS  negative psych ROS   GI/Hepatic Neg liver ROS, GERD  Controlled and Medicated,  Endo/Other  diabetes, Oral Hypoglycemic AgentsMorbid obesity (BMI 54)  Renal/GU negative Renal ROS  negative genitourinary   Musculoskeletal  (+) Arthritis , Rheumatoid disorders,    Abdominal   Peds  Hematology negative hematology ROS (+)   Anesthesia Other Findings   Reproductive/Obstetrics                           Anesthesia Physical Anesthesia Plan  ASA: III  Anesthesia Plan: General   Post-op Pain Management:    Induction: Intravenous  PONV Risk Score and Plan: 1 and Midazolam, Dexamethasone and Ondansetron  Airway Management Planned: Oral ETT  Additional Equipment:   Intra-op Plan:   Post-operative Plan: Extubation in OR  Informed Consent: I have reviewed the patients History and Physical, chart, labs and discussed the procedure including the risks, benefits and alternatives for the proposed anesthesia with the patient or authorized representative who has indicated his/her understanding and acceptance.     Dental advisory given  Plan Discussed with: CRNA  Anesthesia Plan Comments:         Anesthesia Quick Evaluation

## 2020-05-11 NOTE — Anesthesia Postprocedure Evaluation (Signed)
Anesthesia Post Note  Patient: Darren Sweeney  Procedure(s) Performed: HEMORRHOIDECTOMY WITH LIGATION AND HEMORRHOIDOPEXY ANORECTAL EXAMINATION UNDER ANESTHESIA (N/A Rectum)     Patient location during evaluation: PACU Anesthesia Type: General Level of consciousness: awake and alert Pain management: pain level controlled Vital Signs Assessment: post-procedure vital signs reviewed and stable Respiratory status: spontaneous breathing, nonlabored ventilation, respiratory function stable and patient connected to nasal cannula oxygen Cardiovascular status: blood pressure returned to baseline and stable Postop Assessment: no apparent nausea or vomiting Anesthetic complications: no   No complications documented.  Last Vitals:  Vitals:   05/11/20 1230 05/11/20 1300  BP: (!) 149/83 (!) 152/98  Pulse: (!) 101 92  Resp: (!) 27 (!) 24  Temp:  36.7 C  SpO2: 94% 95%    Last Pain:  Vitals:   05/11/20 1300  TempSrc: Oral  PainSc:                  Khameron Gruenwald L Laniece Hornbaker

## 2020-05-11 NOTE — Transfer of Care (Signed)
Immediate Anesthesia Transfer of Care Note  Patient: Darren Sweeney  Procedure(s) Performed: HEMORRHOIDECTOMY WITH LIGATION AND HEMORRHOIDOPEXY ANORECTAL EXAMINATION UNDER ANESTHESIA (N/A Rectum)  Patient Location: PACU  Anesthesia Type:general  Level of Consciousness: awake, alert  and oriented  Airway & Oxygen Therapy: Patient Spontanous Breathing and Patient connected to face mask  Post-op Assessment: Report given to RN and Post -op Vital signs reviewed and stable  Post vital signs: Reviewed and stable  Last Vitals:  Vitals Value Taken Time  BP 172/93 05/11/20 1200  Temp    Pulse 98 05/11/20 1205  Resp 24 05/11/20 1205  SpO2 100 % 05/11/20 1205  Vitals shown include unvalidated device data.  Last Pain:  Vitals:   05/11/20 0856  TempSrc:   PainSc: 0-No pain      Patients Stated Pain Goal: 3 (05/06/20 1312)  Complications: No complications documented.

## 2020-05-11 NOTE — H&P (Signed)
[name] Appointment: 02/27/2020 2:30 PM DOB: Dec 25, 1970 Married / Language: Cleophus Molt / Race: White Male  Patient Care Team: [place] [place] as PCP - General Otis Brace, MD as Consulting Physician (Gastroenterology) Michael Boston, MD as Consulting Physician (General Surgery) Charlaine Dalton, MD as Referring Physician (Pulmonary Disease)  ` Patient sent for surgical consultation at the request of [facility]  Chief Complaint: Rectal bleeding. Hemorrhoids. ` ` The patient is a pleasant morbidly obese male. He insisted on his wife being in the room. He stroke with hemorrhoid a issues more recently. He is to move his bowels maybe once or twice a day. [name] had some episodes of loose bowel movements sometimes up to 6 times a day. Noted some bleeding and pain with hard bowel movements in the past. Feels like something is out all the time. At some over-the-counter Preparation H. Helps a little bit. However he feels that hemorrhoid is out all the time now with some itching and irritating and bleeding. Patient had upper and lower endoscopy that noted some benign gastric polyps as well as a few hyperplastic and tubular adenomatous colon polyps. Rather small. Mild gastritis. No other major colon problems but Enlarged hemorrhoids noted. Surgical consultation offered. He does smoke about a pack a day. Sleep apnea using CPAP. No prior abdominal or anorectal surgeries. No prior banding's. No urinary tract infections. He is rather hard of hearing to do an anterior injury. 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  No personal nor family history of GI/colon cancer, inflammatory bowel disease, irritable bowel syndrome, allergy such as Celiac Sprue, dietary/dairy problems, colitis, ulcers nor gastritis. No recent sick contacts/gastroenteritis. No travel outside the country. No changes in diet.  No dysphagia to solids or liquids. No significant heartburn or reflux. No melena, hematemesis, coffee ground emesis. No evidence of prior gastric/peptic ulceration.  (Review of systems as stated in this history (HPI) or in the review of systems. Otherwise all other 12 point ROS are negative) ` ` ###########################################`  This patient encounter took 35 minutes today to perform the following: obtain history, perform exam, review outside records, interpret tests & imaging, counsel the patient on their diagnosis; and, document this encounter, including findings & plan in the electronic health record (EHR).   Past Surgical History Darden Palmer, [place]; 02/27/2020 2:03 PM) Colon Polyp Removal - Colonoscopy Shoulder Surgery Right.  Diagnostic Studies History Darden Palmer, [place]; 02/27/2020 2:03 PM) Colonoscopy within last year  Allergies Darden Palmer, RMA; 02/27/2020 2:04 PM) No Known Drug Allergies [02/27/2020]: Allergies Reconciled  Medication History Darden Palmer, [place]; 02/27/2020 2:05 PM) Hyoscyamine Sulfate (0.125MG  Tab Sublingual, Sublingual) Active. Etodolac ER (500MG  Tablet ER 24HR, Oral) Active. Ezetimibe (10MG  Tablet, Oral) Active. Fenofibrate Micronized (200MG  Capsule, Oral) Active. Losartan Potassium (25MG  Tablet, Oral) Active. metFORMIN HCl (1000MG  Tablet, Oral) Active. Atorvastatin Calcium (10MG  Tablet, Oral) Active. Diclofenac Sodium (75MG  Tablet DR, Oral) Active. Medications Reconciled  Social History Darden Palmer, [place]; 02/27/2020 2:03 PM) Caffeine use Carbonated beverages. Tobacco use Current every day smoker.  Family History Darden Palmer, [place]; 02/27/2020 2:03 PM) Cancer Brother. Cerebrovascular Accident Brother, Father, Sister. Diabetes Mellitus Brother, Sister. Heart Disease Father, Mother. Heart disease in male family member before age 46 Hypertension Father, Mother. Respiratory Condition  Father.  Other Problems Darden Palmer, [place]; 02/27/2020 2:03 PM) Diabetes Mellitus Hemorrhoids Hypercholesterolemia Sleep Apnea     Review of Systems Darden Palmer RMA; 02/27/2020 2:03 PM) General Not  Present- Appetite Loss, Chills, Fatigue, Fever, Night Sweats, Weight Gain and Weight Loss. Skin Not Present- Change in Wart/Mole, Dryness, Hives, Jaundice, New Lesions, Non-Healing Wounds, Rash and Ulcer. HEENT Present- Hearing Loss. Not Present- Earache, Hoarseness, Nose Bleed, Oral Ulcers, Ringing in the Ears, Seasonal Allergies, Sinus Pain, Sore Throat, Visual Disturbances, Wears glasses/contact lenses and Yellow Eyes. Breast Not Present- Breast Mass, Breast Pain, Nipple Discharge and Skin Changes. Cardiovascular Present- Leg Cramps. Not Present- Chest Pain, Difficulty Breathing Lying Down, Palpitations, Rapid Heart Rate, Shortness of Breath and Swelling of Extremities. Gastrointestinal Present- Abdominal Pain, Bloody Stool, Hemorrhoids and Rectal Pain. Not Present- Bloating, Change in Bowel Habits, Chronic diarrhea, Constipation, Difficulty Swallowing, Excessive gas, Gets full quickly at meals, Indigestion, Nausea and Vomiting. Male Genitourinary Not Present- Blood in Urine, Change in Urinary Stream, Frequency, Impotence, Nocturia, Painful Urination, Urgency and Urine Leakage.  Vitals Lattie Haw Caldwell RMA; 02/27/2020 2:06 PM) 02/27/2020 2:05 PM Weight: 374.13 lb Temp.: 98.24F  Pulse: 128 (Regular)  P.OX: 94% (Room air) BP: 142/86(Sitting, Left Arm, Standard)   Ht 5\' 10"  (1.778 m)   Wt (!) 170.1 kg   BMI 53.81 kg/m  05/11/2020      Physical Exam Adin Hector MD; 02/27/2020 2:58 PM)  General Mental Status-Alert. General Appearance-Not in acute distress, Not Sickly. Orientation-Oriented X3. Hydration-Well hydrated. Voice-Normal.  Integumentary Global Assessment Upon inspection and palpation of skin surfaces of the - Axillae:  non-tender, no inflammation or ulceration, no drainage. and Distribution of scalp and body hair is normal. General Characteristics Temperature - normal warmth is noted.  Head and Neck Head-normocephalic, atraumatic with no lesions or palpable masses. Face Global Assessment - atraumatic, no absence of expression. Neck Global Assessment - no abnormal movements, no bruit auscultated on the right, no bruit auscultated on the left, no decreased range of motion, non-tender. Trachea-midline. Thyroid Gland Characteristics - non-tender.  Eye Eyeball - Left-Extraocular movements intact, No Nystagmus - Left. Eyeball - Right-Extraocular movements intact, No Nystagmus - Right. Cornea - Left-No Hazy - Left. Cornea - Right-No Hazy - Right. Sclera/Conjunctiva - Left-No scleral icterus, No Discharge - Left. Sclera/Conjunctiva - Right-No scleral icterus, No Discharge - Right. Pupil - Left-Direct reaction to light normal. Pupil - Right-Direct reaction to light normal.  ENMT Ears Pinna - Left - no drainage observed, no generalized tenderness observed. Pinna - Right - no drainage observed, no generalized tenderness observed. Nose and Sinuses External Inspection of the Nose - no destructive lesion observed. Inspection of the nares - Left - quiet respiration. Inspection of the nares - Right - quiet respiration. Mouth and Throat Lips - Upper Lip - no fissures observed, no pallor noted. Lower Lip - no fissures observed, no pallor noted. Nasopharynx - no discharge present. Oral Cavity/Oropharynx - Tongue - no dryness observed. Oral Mucosa - no cyanosis observed. Hypopharynx - no evidence of airway distress observed.  Chest and Lung Exam Inspection Movements - Normal and Symmetrical. Accessory muscles - No use of accessory muscles in breathing. Palpation Palpation of the chest reveals - Non-tender. Auscultation Breath sounds - Normal and  Clear.  Cardiovascular Auscultation Rhythm - Regular. Murmurs & Other Heart Sounds - Auscultation of the heart reveals - No Murmurs and No Systolic Clicks.  Abdomen Inspection Inspection of the abdomen reveals - No Visible peristalsis and No Abnormal pulsations. Umbilicus - No Bleeding, No Urine drainage. Palpation/Percussion Palpation and Percussion of the abdomen reveal - Soft, Non Tender, No Rebound tenderness, No Rigidity (guarding) and No Cutaneous hyperesthesia. Note: Abdomen soft. Nontender. Not  distended. No umbilical or incisional hernias. No guarding.  Male Genitourinary Sexual Maturity Tanner 5 - Adult hair pattern and Adult penile size and shape.  Rectal Note: Large right posterior grade 4 prolapsed hemorrhoid. Left lateral perianal pruritus.  Perianal skin clean with good hygiene. No pilonidal disease. No fissure. No abscess/fistula. Normal sphincter tone. No condyloma warts.    Peripheral Vascular Upper Extremity Inspection - Left - No Cyanotic nailbeds - Left, Not Ischemic. Inspection - Right - No Cyanotic nailbeds - Right, Not Ischemic.  Neurologic Neurologic evaluation reveals -normal attention span and ability to concentrate, able to name objects and repeat phrases. Appropriate fund of knowledge , normal sensation and normal coordination. Mental Status Affect - not angry, not paranoid. Cranial Nerves-Normal Bilaterally. Gait-Normal.  Neuropsychiatric Mental status exam performed with findings of-able to articulate well with normal speech/language, rate, volume and coherence, thought content normal with ability to perform basic computations and apply abstract reasoning and no evidence of hallucinations, delusions, obsessions or homicidal/suicidal ideation.  Musculoskeletal Global Assessment Spine, Ribs and Pelvis - no instability, subluxation or laxity. Right Upper Extremity - no instability, subluxation or  laxity.  Lymphatic Head & Neck  General Head & Neck Lymphatics: Bilateral - Description - No Localized lymphadenopathy. Axillary  General Axillary Region: Bilateral - Description - No Localized lymphadenopathy. Femoral & Inguinal  Generalized Femoral & Inguinal Lymphatics: Left - Description - No Localized lymphadenopathy. Right - Description - No Localized lymphadenopathy.   Results Adin Hector MD; 02/27/2020 3:10 PM) Procedures  Name Value Date Hemorrhoids Procedure Anal exam: External Hemorrhoid Internal exam: Internal Hemorrhoids (bleeding) Internal Hemorroids ( non-bleeding) prolapse Other: Large right posterior grade 4 prolapsed hemorrhoid. Left lateral perianal pruritus............Marland KitchenPerianal skin clean with good hygiene. No pilonidal disease. No fissure. No abscess/fistula. Normal sphincter tone. No condyloma warts............Marland KitchenTolerates digital and anoscopic rectal exam. No rectal masses.  Performed: 02/27/2020 2:59 PM    Assessment & Plan  PROLAPSED INTERNAL HEMORRHOIDS, GRADE 4 (K64.3) Impression: Irritated internal hemorrhoids with bleeding with right posterior pile chronically prolapsed. I think he would benefit from surgery. Outpatient hemorrhoidal ligation and pexy. Hemorrhoidectomy of remaining tissue.  Hopefully he can ride this out until he can take time off during the winter holidays when its less disruptive. He is hoping to plan for that. We'll work to coordinate convenient times.  The anatomy & physiology of the anorectal region was discussed. The pathophysiology of hemorrhoids and differential diagnosis was discussed. Natural history progression was discussed. I stressed the importance of a bowel regimen to have daily soft bowel movements to minimize progression of disease. Goal of one BM / day ideal. Use of wet wipes, warm baths, avoiding straining, etc were emphasized.  Educational handouts further  explaining the pathology, treatment options, and bowel regimen were given as well. The patient expressed understanding.  The anatomy & physiology of the anorectal region was discussed. The pathophysiology of hemorrhoids and differential diagnosis was discussed. Natural history risks without surgery was discussed. I stressed the importance of a bowel regimen to have daily soft bowel movements to minimize progression of disease. Interventions such as sclerotherapy & banding were discussed.  The patient's symptoms are not adequately controlled by medicines and other non-operative treatments. I feel the risks & problems of no surgery outweigh the operative risks; therefore, I recommended surgery to treat the hemorrhoids by ligation, pexy, and possible resection.  Risks such as bleeding, infection, urinary difficulties, need for further treatment, heart attack, death, and other risks were discussed. I noted a good likelihood  this will help address the problem. Goals of post-operative recovery were discussed as well. Possibility that this will not correct all symptoms was explained. Post-operative pain, bleeding, constipation, and other problems after surgery were discussed. We will work to minimize complications. Educational handouts further explaining the pathology, treatment options, and bowel regimen were given as well. Questions were answered. The patient expresses understanding & wishes to proceed with surgery.  PRURITUS ANI (L29.0) Impression: Some irritation pruritus most likely due to his diarrhea and prolapsed hemorrhoid. Consider trying to keep the area clean and dry. Try cotton balls or powders to help dry the skin & keep it from getting more irritated.  Current Plans Pt Education - [facility] Pruritus Ani (AT)  Ardeth Sportsman, MD, FACS, MASCRS Gastrointestinal and Minimally Invasive Surgery  [facility] [address] [place], [place]  [address] [phone] Fax [phone] Main/Paging  CONTACT INFORMATION: Weekday (9AM-5PM) concerns: Call [facility] main office at [phone] Weeknight (5PM-9AM) or Weekend/Holiday concerns: Check [email or web address] for General Surgery [facility] coverage (Please, do not use SecureChat as it is not reliable communication to operating surgeons for immediate patient care)

## 2020-05-11 NOTE — Discharge Instructions (Signed)
ANORECTAL SURGERY:  POST OPERATIVE INSTRUCTIONS  ######################################################################  EAT Start with a pureed / full liquid diet After 24 hours, gradually transition to a high fiber diet.    CONTROL PAIN Control pain so you can tolerate bowel movements,  walk, sleep, tolerate sneezing/coughing, and go up/down stairs.   HAVE A BOWEL MOVEMENT DAILY Keep your bowels regular to avoid problems.   Taking a fiber supplement every day to keep bowels soft.   Try a laxative to override constipation. Use an antidairrheal to slow down diarrhea.   Call if not better after 2 tries  WALK Walk an hour a day.  Control your pain to do that.   CALL IF YOU HAVE PROBLEMS/CONCERNS Call if you are still struggling despite following these instructions. Call if you have concerns not answered by these instructions  ######################################################################    1. Take your usually prescribed home medications unless otherwise directed.  2. DIET: Follow a light bland diet & liquids the first 24 hours after arrival home, such as soup, liquids, starches, etc.  Be sure to drink plenty of fluids.  Quickly advance to a usual solid diet within a few days.  Avoid fast food or heavy meals as your are more likely to get nauseated or have irregular bowels.  A low-fat, high-fiber diet for the rest of your life is ideal.  3. PAIN CONTROL: a. Pain is best controlled by a usual combination of three different methods TOGETHER: i. Ice/Heat ii. Over the counter pain medication iii. Prescription pain medication b. Expect swelling and discomfort in the anus/rectal area.  Warm water baths (30-60 minutes up to 6 times a day, especially after bowel meovements) will help. Use ice for the first few days to help decrease swelling and bruising, then switch to heat such as warm towels, sitz baths, warm baths, etc to help relax tight/sore spots and speed recovery.   Some people prefer to use ice alone, heat alone, alternating between ice & heat.  Experiment to what works for you.   c. It is helpful to take an over-the-counter pain medication continuously for the first few weeks.  Choose one of the following that works best for you: i. Naproxen (Aleve, etc)  Two 250m tabs twice a day ii. Ibuprofen (Advil, etc) Three 2072mtabs four times a day (every meal & bedtime) iii. Acetaminophen (Tylenol, etc) 500-65044mour times a day (every meal & bedtime) d. A  prescription for pain medication (such as oxycodone, hydrocodone, etc) should be given to you upon discharge.  Take your pain medication as prescribed.  i. If you are having problems/concerns with the prescription medicine (does not control pain, nausea, vomiting, rash, itching, etc), please call us [phone] see if we need to switch you to a different pain medicine that will work better for you and/or control your side effect better. ii. If you need a refill on your pain medication, please contact your pharmacy.  They will contact our office to request authorization. Prescriptions will not be filled after 5 pm or on week-ends.  If can take up to 48 hours for it to be filled & ready so avoid waiting until you are down to thel ast pill. e. A topical cream (Dibucaine) or a prescription for a cream (such as diltiazem 2% gel) may be given to you.  Many people find relief with topical creams.  Some people find it burns too much.  Experiment.  If it helps, use it.  If it burns, don't using  it.  Use a Sitz Bath 4-8 times a day for relief   [employer] A sitz bath is a warm water bath taken in the sitting position that covers only the hips and buttocks. It may be used for either healing or hygiene purposes. Sitz baths are also used to relieve pain, itching, or muscle spasms. The water may contain medicine. Moist heat will help you heal and relax.  HOME CARE INSTRUCTIONS  Take 3 to 4 sitz baths a day. 1. Fill the  bathtub half full with warm water. 2. Sit in the water and open the drain a little. 3. Turn on the warm water to keep the tub half full. Keep the water running constantly. 4. Soak in the water for 15 to 20 minutes. 5. After the sitz bath, pat the affected area dry first.   4. KEEP YOUR BOWELS REGULAR a. The goal is one soft bowel movement a day b. Avoid getting constipated.  Between the surgery and the pain medications, it is common to experience some constipation.  Increasing fluid intake and taking a fiber supplement (such as Metamucil, Citrucel, FiberCon, MiraLax, etc) 2-3 times a day regularly will usually help prevent this problem from occurring.  A mild laxative (prune juice, Milk of Magnesia, MiraLax, etc) should be taken according to package directions if there are no bowel movements after 48 hours. c. Watch out for diarrhea.  If you have many loose bowel movements, simplify your diet to bland foods & liquids for a few days.  Stop any stool softeners and decrease your fiber supplement.  Switching to mild anti-diarrheal medications (Kayopectate, Pepto Bismol) can help.  Can try an imodium/loperamide dose.  If this worsens or does not improve, please call us.  5. Wound Care  a. Remove your bandages with your first bowel movement, usually the day after surgery.  Let the gauze fall off with the first bowel movement or shower.   b. Wear an absorbent pad or soft cotton balls in your underwear as needed to catch any drainage and help keep the area  c. Keep the area clean and dry.  Bathe / shower every day.  Keep the area clean by showering / bathing over the incision / wound.   It is okay to soak an open wound to help wash it.  Consider using a squeeze bottle filled with warm water to gently wash the anal area.  Wet wipes or showers / gentle washing after bowel movements is often less traumatic than regular toilet paper. d. Dennis Bast will often notice bleeding with bowel movements.  This should slow down  by the end of the first week of surgery.  Sitting on an ice pack can help. e. Expect some drainage.  This should slow down by the end of the first week of surgery, but you will have occasional bleeding or drainage up to a few months after surgery.  Wear an absorbent pad or soft cotton gauze in your underwear until the drainage stops.  6. ACTIVITIES as tolerated:   a. You may resume regular (light) daily activities beginning the next day--such as daily self-care, walking, climbing stairs--gradually increasing activities as tolerated.  If you can walk 30 minutes without difficulty, it is safe to try more intense activity such as jogging, treadmill, bicycling, low-impact aerobics, swimming, etc. b. Save the most intensive and strenuous activity for last such as sit-ups, heavy lifting, contact sports, etc  Refrain from any heavy lifting or straining until you are off narcotics for  pain control.   c. DO NOT PUSH THROUGH PAIN.  Let pain be your guide: If it hurts to do something, don't do it.  Pain is your body warning you to avoid that activity for another week until the pain goes down. d. You may drive when you are no longer taking prescription pain medication, you can comfortably sit for long periods of time, and you can safely maneuver your car and apply brakes. e. You may have sexual intercourse when it is comfortable.  7. FOLLOW UP in our office a. Please call [facility] at [phone] to set up an appointment to see your surgeon in the office for a follow-up appointment approximately 2-3 weeks after your surgery. b. Make sure that you call for this appointment the day you arrive home to ensure a convenient appointment time.  8. IF YOU HAVE DISABILITY OR FAMILY LEAVE FORMS, BRING THEM TO THE OFFICE FOR PROCESSING.  DO NOT GIVE THEM TO YOUR DOCTOR.        WHEN TO CALL US [phone]: 1. Poor pain control 2. Reactions / problems with new medications (rash/itching, nausea, etc)  3. Fever over  101.5 F (38.5 C) 4. Inability to urinate 5. Nausea and/or vomiting 6. Worsening swelling or bruising 7. Continued bleeding from incision. 8. Increased pain, redness, or drainage from the incision  The clinic staff is available to answer your questions during regular business hours (8:30am-5pm).  Please don't hesitate to call and ask to speak to one of our nurses for clinical concerns.   A surgeon from [facility] is always on call at the hospitals   If you have a medical emergency, go to the nearest emergency room or call 911.    [facility] [address], [address], [place],   [address] ? MAIN: [phone] ? TOLL FREE: [phone] ? FAX [phone] [email or web address]     HEMORRHOIDS   Hemorrhoidal piles are natural clusters of blood vessels that help the rectum and anal canal stretch to hold stool and allow bowel movements.  Most people will develop a flare of hemorrhoids in their lifetime.  When hemorrhoidals are irritated, they can swell, burn, itch, cause pain, and bleed.  Most flares will calm down gradually within a few weeks.  However, once hemorrhoids are created, they tend to flare more easily.  Fortunately, good habits and simple medical treatment usually control hemorrhoids well, and surgery is needed only in severe cases.  TREATMENT OF HEMORRHOID FLARE Warm soaks. 4-8 times a day This helps more than any topical medication.   1. A sitz bath is a warm water bath taken in the sitting position that covers only the hips and buttocks.Fill the bathtub half full with warm water. 2. Soak in the water for 15 to 30 minutes. 3. After the sitz bath, pat the affected area dry first.  Normalize your bowels.  Extremes of diarrhea or constipation will make hemorrhoids worse.  One soft bowel movement a day is the goal.   Wet wipes instead of toilet paper Pain control with a NSAID such as ibuprofen (Advil) or naproxen (Aleve) or  acetaminophen (Tylenol) around the clock.  Narcotics are constipating and should be minimized if possible Topical creams contain steroids (bydrocortisone) or local anesthetic (xylocaine) can help make pain and itching more tolerable.    TROUBLESHOOTING IRREGULAR BOWELS 1) Avoid extremes of bowel movements (no bad constipation/diarrhea) 2) Miralax 17gm in 8oz. water or juice every day. May use twice a   day.  3) Gas-x or Phazyme as needed for gas & bloating.  4) Soft & bland diet. No spicy, greasy, or fried foods.  5) Omeprazole over-the-counter as needed  6) May hold gluten/wheat products from diet to see if symptoms improve.  7)  May try probiotics (Align, Activa, etc) to help calm the bowels down 7) If symptoms become worse: Call back immediately.   

## 2020-05-11 NOTE — Anesthesia Procedure Notes (Signed)
Procedure Name: Intubation Performed by: Sudie Grumbling, CRNA Pre-anesthesia Checklist: Patient identified, Emergency Drugs available, Suction available and Patient being monitored Patient Re-evaluated:Patient Re-evaluated prior to induction Oxygen Delivery Method: Circle system utilized Preoxygenation: Pre-oxygenation with 100% oxygen Induction Type: IV induction and Rapid sequence Laryngoscope Size: Glidescope and 4 Grade View: Grade I Tube type: Oral Tube size: 8.0 mm Number of attempts: 1 Airway Equipment and Method: Stylet Placement Confirmation: ETT inserted through vocal cords under direct vision,  positive ETCO2 and breath sounds checked- equal and bilateral Secured at: 22 cm Tube secured with: Tape Dental Injury: Teeth and Oropharynx as per pre-operative assessment

## 2020-05-12 ENCOUNTER — Encounter (HOSPITAL_COMMUNITY): Payer: Self-pay | Admitting: Surgery

## 2020-05-12 LAB — SURGICAL PATHOLOGY

## 2020-05-19 DIAGNOSIS — E1159 Type 2 diabetes mellitus with other circulatory complications: Secondary | ICD-10-CM | POA: Diagnosis not present

## 2020-05-19 DIAGNOSIS — E7800 Pure hypercholesterolemia, unspecified: Secondary | ICD-10-CM | POA: Diagnosis not present

## 2020-05-19 DIAGNOSIS — I10 Essential (primary) hypertension: Secondary | ICD-10-CM | POA: Diagnosis not present

## 2020-05-26 DIAGNOSIS — Z111 Encounter for screening for respiratory tuberculosis: Secondary | ICD-10-CM | POA: Diagnosis not present

## 2020-05-26 DIAGNOSIS — R5383 Other fatigue: Secondary | ICD-10-CM | POA: Diagnosis not present

## 2020-05-26 DIAGNOSIS — M0579 Rheumatoid arthritis with rheumatoid factor of multiple sites without organ or systems involvement: Secondary | ICD-10-CM | POA: Diagnosis not present

## 2020-05-26 DIAGNOSIS — Z79899 Other long term (current) drug therapy: Secondary | ICD-10-CM | POA: Diagnosis not present

## 2020-07-22 DIAGNOSIS — M0579 Rheumatoid arthritis with rheumatoid factor of multiple sites without organ or systems involvement: Secondary | ICD-10-CM | POA: Diagnosis not present

## 2020-07-22 DIAGNOSIS — Z79899 Other long term (current) drug therapy: Secondary | ICD-10-CM | POA: Diagnosis not present

## 2020-08-21 DIAGNOSIS — M25562 Pain in left knee: Secondary | ICD-10-CM | POA: Diagnosis not present

## 2020-08-21 DIAGNOSIS — M25561 Pain in right knee: Secondary | ICD-10-CM | POA: Diagnosis not present

## 2020-08-21 DIAGNOSIS — Z79899 Other long term (current) drug therapy: Secondary | ICD-10-CM | POA: Diagnosis not present

## 2020-08-21 DIAGNOSIS — M0579 Rheumatoid arthritis with rheumatoid factor of multiple sites without organ or systems involvement: Secondary | ICD-10-CM | POA: Diagnosis not present

## 2020-08-21 DIAGNOSIS — M150 Primary generalized (osteo)arthritis: Secondary | ICD-10-CM | POA: Diagnosis not present

## 2020-09-02 DIAGNOSIS — M25561 Pain in right knee: Secondary | ICD-10-CM | POA: Diagnosis not present

## 2020-09-16 DIAGNOSIS — Z79899 Other long term (current) drug therapy: Secondary | ICD-10-CM | POA: Diagnosis not present

## 2020-09-16 DIAGNOSIS — M0579 Rheumatoid arthritis with rheumatoid factor of multiple sites without organ or systems involvement: Secondary | ICD-10-CM | POA: Diagnosis not present

## 2020-09-30 DIAGNOSIS — M25561 Pain in right knee: Secondary | ICD-10-CM | POA: Diagnosis not present

## 2020-11-17 DIAGNOSIS — M0579 Rheumatoid arthritis with rheumatoid factor of multiple sites without organ or systems involvement: Secondary | ICD-10-CM | POA: Diagnosis not present

## 2020-11-17 DIAGNOSIS — Z79899 Other long term (current) drug therapy: Secondary | ICD-10-CM | POA: Diagnosis not present

## 2020-12-04 DIAGNOSIS — M25561 Pain in right knee: Secondary | ICD-10-CM | POA: Diagnosis not present

## 2021-01-12 DIAGNOSIS — M0579 Rheumatoid arthritis with rheumatoid factor of multiple sites without organ or systems involvement: Secondary | ICD-10-CM | POA: Diagnosis not present

## 2021-01-12 DIAGNOSIS — Z79899 Other long term (current) drug therapy: Secondary | ICD-10-CM | POA: Diagnosis not present

## 2021-01-22 DIAGNOSIS — M25561 Pain in right knee: Secondary | ICD-10-CM | POA: Diagnosis not present

## 2021-02-02 DIAGNOSIS — S83231D Complex tear of medial meniscus, current injury, right knee, subsequent encounter: Secondary | ICD-10-CM | POA: Diagnosis not present

## 2021-02-02 DIAGNOSIS — M25561 Pain in right knee: Secondary | ICD-10-CM | POA: Diagnosis not present

## 2021-02-02 DIAGNOSIS — I10 Essential (primary) hypertension: Secondary | ICD-10-CM | POA: Diagnosis not present

## 2021-02-02 DIAGNOSIS — R079 Chest pain, unspecified: Secondary | ICD-10-CM | POA: Diagnosis not present

## 2021-02-02 DIAGNOSIS — K219 Gastro-esophageal reflux disease without esophagitis: Secondary | ICD-10-CM | POA: Diagnosis not present

## 2021-02-02 DIAGNOSIS — R002 Palpitations: Secondary | ICD-10-CM | POA: Diagnosis not present

## 2021-02-23 DIAGNOSIS — M25562 Pain in left knee: Secondary | ICD-10-CM | POA: Diagnosis not present

## 2021-02-23 DIAGNOSIS — M0579 Rheumatoid arthritis with rheumatoid factor of multiple sites without organ or systems involvement: Secondary | ICD-10-CM | POA: Diagnosis not present

## 2021-02-23 DIAGNOSIS — M150 Primary generalized (osteo)arthritis: Secondary | ICD-10-CM | POA: Diagnosis not present

## 2021-02-23 DIAGNOSIS — Z79899 Other long term (current) drug therapy: Secondary | ICD-10-CM | POA: Diagnosis not present

## 2021-02-24 NOTE — H&P (View-Only) (Signed)
Cardiology Office Note:    Date:  02/26/2021   ID:  Darren Sweeney, DOB [birth year], MRN [id number]  PCP:  [facility], [place] Providers Cardiologist:  None     Referring MD: [facility]*   Chief Complaint  Patient presents with   Chest Pain    History of Present Illness:    Darren Sweeney is a 50 y.o. male with a hx of obesity, hypertension, smoker,  DMII A1c 8.7, no CVD hx, cardiology referral from rheumatologist 2/2 progressive chest pain  Darren Sweeney states that for a year he's had sternal chest pain with activity. This has progressed and he in the AM he notes a sharp pain for 5 minutes that improves with calming down. He has progressive dyspnea on exertion. He is morbidly obese. He is an active smoker for the last 30 years. He has strong family hx of early stroke, noted in siblings deceased in their 95s. His blood pressure has gotten worse, now typically in the 170s SBP. His losartan was just increased from 25 to 100 mg. His blood glucose has been higher lately as well. His wife is concerned he has coronary disease.  CVD Risk/Equivalent: HLD- on atorvastatin 10 mg HTN- yes, poorly controlled PAD- None DMII - Yes, not well controlled A1c 8.7 Smoker- yes Family Hx- yes, stroke   Past Medical History:  Diagnosis Date   Deafness, left    Diabetes mellitus without complication (HCC)    GERD (gastroesophageal reflux disease)    Hypertension    RA (rheumatoid arthritis) (HCC)    Shortness of breath dyspnea    on exertion   Sleep apnea    Wears glasses     Past Surgical History:  Procedure Laterality Date   BIOPSY  02/06/2020   Procedure: BIOPSY;  Surgeon: Otis Brace, MD;  Location: [facility];  Service: Gastroenterology;;  EGD and COLON   COLONOSCOPY WITH PROPOFOL N/A 02/06/2020   Procedure: COLONOSCOPY WITH PROPOFOL;  Surgeon: Otis Brace, MD;  Location: [facility];  Service: Gastroenterology;  Laterality:  N/A;   ESOPHAGOGASTRODUODENOSCOPY (EGD) WITH PROPOFOL N/A 02/06/2020   Procedure: ESOPHAGOGASTRODUODENOSCOPY (EGD) WITH PROPOFOL;  Surgeon: Otis Brace, MD;  Location: [facility];  Service: Gastroenterology;  Laterality: N/A;   EVALUATION UNDER ANESTHESIA WITH HEMORRHOIDECTOMY N/A 05/11/2020   Procedure: HEMORRHOIDECTOMY WITH LIGATION AND HEMORRHOIDOPEXY ANORECTAL EXAMINATION UNDER ANESTHESIA;  Surgeon: Michael Boston, MD;  Location: [facility];  Service: General;  Laterality: N/A;   FINGER AMPUTATION Left    ring finger amputation   POLYPECTOMY  02/06/2020   Procedure: POLYPECTOMY;  Surgeon: Otis Brace, MD;  Location: [facility];  Service: Gastroenterology;;  EGD and COLON   SHOULDER ARTHROSCOPY WITH DISTAL CLAVICLE RESECTION Right 05/07/2015   Procedure: SHOULDER DIAGNOSTIC OPERATIVE ARTHROSCOPY WITH DEBRIDEMENT AND DISTAL CLAVICLE EXCISION;  Surgeon: Meredith Pel, MD;  Location: [facility];  Service: Orthopedics;  Laterality: Right;   TUMOR REMOVAL  2014   on voice box   WISDOM TOOTH EXTRACTION      Current Medications: Current Meds  Medication Sig   APPLE CIDER VINEGAR PO Take 1,200 mg by mouth every evening.   atorvastatin (LIPITOR) 10 MG tablet Take 10 mg by mouth every morning.   atorvastatin (LIPITOR) 10 MG tablet 1 tablet   etodolac (LODINE XL) 500 MG 24 hr tablet Take 1 tablet by mouth 2 (two) times daily with a meal.   etodolac (LODINE) 500 MG tablet Take 500 mg by mouth 2 (two)  times daily.   ezetimibe (ZETIA) 10 MG tablet Take 10 mg by mouth every morning.   fenofibrate micronized (LOFIBRA) 200 MG capsule Take 200 mg by mouth daily.   folic acid (FOLVITE) 1 MG tablet Take 1 mg by mouth 2 (two) times daily.   glipiZIDE (GLUCOTROL) 5 MG tablet Take 5 mg by mouth daily.   golimumab (SIMPONI ARIA) 50 MG/4ML SOLN injection 2mg /kg   losartan (COZAAR) 100 MG tablet Take 100 mg by mouth daily.   losartan (COZAAR) 25 MG tablet Take 25 mg by mouth daily.    metFORMIN  (GLUCOPHAGE) 1000 MG tablet Take 1,000 mg by mouth 2 (two) times daily with a meal.   methotrexate 50 MG/2ML injection Inject 0.8 mLs (20 mg total) into the skin once a week. (Patient taking differently: Inject 20 mg into the skin every Sunday. Every Sunday)   metoprolol succinate (TOPROL-XL) 50 MG 24 hr tablet Take 1 tablet (50 mg total) by mouth daily. Take with or immediately following a meal.   Misc Natural Products (TART CHERRY ADVANCED PO) Take 1 capsule by mouth every morning.    Omega 3 1200 MG CAPS Take 1,200 mg by mouth daily.    Omega-3 Fatty Acids (FISH OIL) 435 MG CAPS 1 capsule   omeprazole (PRILOSEC) 40 MG capsule Take 40 mg by mouth daily.      Allergies:   Patient has no known allergies.   Social History   Socioeconomic History   Marital status: Married    Spouse name: Not on file   Number of children: Not on file   Years of education: Not on file   Highest education level: Not on file  Occupational History   Not on file  Tobacco Use   Smoking status: Every Day    Packs/day: 1.50    Years: 28.00    Pack years: 42.00    Types: Cigarettes   Smokeless tobacco: Never  Vaping Use   Vaping Use: Never used  Substance and Sexual Activity   Alcohol use: No   Drug use: No   Sexual activity: Not on file  Other Topics Concern   Not on file  Social History Narrative   Not on file   Social Determinants of Health   Financial Resource Strain: Not on file  Food Insecurity: Not on file  Transportation Needs: Not on file  Physical Activity: Not on file  Stress: Not on file  Social Connections: Not on file     Family History: The patient's Sibling deceased in there 64s from stroke. Father - stroke   ROS:   Please see the history of present illness.     All other systems reviewed and are negative.  EKGs/Labs/Other Studies Reviewed:    The following studies were reviewed today:  EKG:  EKG is  ordered today.  The ekg ordered today demonstrates   02/26/2021-  Sinus tachcardia HR 101 bpm, RSR', right axis deviation, RVH  no S1Q3T3 02/02/2021- NSR 05/07/2020 - NSR, LAD, IRBBB  Recent Labs: 05/07/2020: BUN 13; Creatinine, Ser 0.85; Hemoglobin 15.3; Platelets 238; Potassium 4.0; Sodium 137  Recent Lipid Panel No results found for: CHOL, TRIG, HDL, CHOLHDL, VLDL, LDLCALC, LDLDIRECT   Risk Assessment/Calculations:     Physical Exam:    VS:  BP (!) 160/86   Pulse 61   Ht 5\' 10"  (1.778 m)   Wt (!) 383 lb (173.7 kg)   SpO2 96%   BMI 54.95 kg/m     Wt Readings from  Last 3 Encounters:  02/26/21 (!) 383 lb (173.7 kg)  05/11/20 (!) 377 lb 6.4 oz (171.2 kg)  05/07/20 (!) 377 lb 12.8 oz (171.4 kg)     GEN:  Well nourished, well developed in no acute distress. Morbidly obese HEENT: Normal NECK: No JVD; No carotid bruits LYMPHATICS: No lymphadenopathy CARDIAC: RRR, no murmurs, rubs, gallops RESPIRATORY:  Clear to auscultation without rales, wheezing or rhonchi  ABDOMEN: Soft, non-tender, non-distended MUSCULOSKELETAL:  No edema; No deformity  SKIN: Warm and dry NEUROLOGIC:  Alert and oriented x 3 PSYCHIATRIC:  Normal affect   ASSESSMENT:    #Angina: Darren. Najjar symptoms are more progressive.and at times at rest which is concerning. EKG did not show ST-T changes.  He is high risk with DMII (A1c 8.7 2021), smoking, family hx and pretest probability is high for coronary disease. Scheduling cath for next week. Counseled him and his wife that if symptoms progress will need to go to the ED.  I counseled him on stopping smoking, diet and weighloss. He has good 2+ radial pulse, normal creatinine in 2021, pending repeat labs. Will also plan for an echo cardiogram. After his cath will work on secondary prevention if he has a lesion including lipid profile, more smoking cessation, weight loss.  #Hypertension: Poorly controlled , will started metoprolol succinate 50mg  XL. His potassium came back at 5.5, changed losartan 100 mg to norvasc 10 mg  daily   PLAN:    In order of problems listed above:  LHC right radial Start metroprolol XL 50 mg daily Echocardiogram complete Follow up in 3 months    Medication Adjustments/Labs and Tests Ordered: Current medicines are reviewed at length with the patient today.  Concerns regarding medicines are outlined above.   Signed, Janina Mayo, MD  02/26/2021 8:33 AM    [facility]

## 2021-02-24 NOTE — Progress Notes (Addendum)
Cardiology Office Note:    Date:  02/26/2021   ID:  Darren Sweeney, DOB Oct 10, 1970, MRN [id number]  PCP:  [facility] Providers Cardiologist:  None     Referring MD: [facility]*   Chief Complaint  Patient presents with   Chest Pain    History of Present Illness:    Darren Sweeney is a 50 y.o. male with a hx of obesity, hypertension, smoker,  DMII A1c 8.7, no CVD hx, cardiology referral from rheumatologist 2/2 progressive chest pain  Darren Sweeney states that for a year he's had sternal chest pain with activity. This has progressed and he in the AM he notes a sharp pain for 5 minutes that improves with calming down. He has progressive dyspnea on exertion. He is morbidly obese. He is an active smoker for the last 30 years. He has strong family hx of early stroke, noted in siblings deceased in their 48s. His blood pressure has gotten worse, now typically in the 170s SBP. His losartan was just increased from 25 to 100 mg. His blood glucose has been higher lately as well. His wife is concerned he has coronary disease.  CVD Risk/Equivalent: HLD- on atorvastatin 10 mg HTN- yes, poorly controlled PAD- None DMII - Yes, not well controlled A1c 8.7 Smoker- yes Family Hx- yes, stroke   Past Medical History:  Diagnosis Date   Deafness, left    Diabetes mellitus without complication (HCC)    GERD (gastroesophageal reflux disease)    Hypertension    RA (rheumatoid arthritis) (HCC)    Shortness of breath dyspnea    on exertion   Sleep apnea    Wears glasses     Past Surgical History:  Procedure Laterality Date   BIOPSY  02/06/2020   Procedure: BIOPSY;  Surgeon: Otis Brace, MD;  Location: [facility];  Service: Gastroenterology;;  EGD and COLON   COLONOSCOPY WITH PROPOFOL N/A 02/06/2020   Procedure: COLONOSCOPY WITH PROPOFOL;  Surgeon: Otis Brace, MD;  Location: [facility];  Service: Gastroenterology;  Laterality:  N/A;   ESOPHAGOGASTRODUODENOSCOPY (EGD) WITH PROPOFOL N/A 02/06/2020   Procedure: ESOPHAGOGASTRODUODENOSCOPY (EGD) WITH PROPOFOL;  Surgeon: Otis Brace, MD;  Location: [facility];  Service: Gastroenterology;  Laterality: N/A;   EVALUATION UNDER ANESTHESIA WITH HEMORRHOIDECTOMY N/A 05/11/2020   Procedure: HEMORRHOIDECTOMY WITH LIGATION AND HEMORRHOIDOPEXY ANORECTAL EXAMINATION UNDER ANESTHESIA;  Surgeon: Michael Boston, MD;  Location: [facility];  Service: General;  Laterality: N/A;   FINGER AMPUTATION Left    ring finger amputation   POLYPECTOMY  02/06/2020   Procedure: POLYPECTOMY;  Surgeon: Otis Brace, MD;  Location: [facility];  Service: Gastroenterology;;  EGD and COLON   SHOULDER ARTHROSCOPY WITH DISTAL CLAVICLE RESECTION Right 05/07/2015   Procedure: SHOULDER DIAGNOSTIC OPERATIVE ARTHROSCOPY WITH DEBRIDEMENT AND DISTAL CLAVICLE EXCISION;  Surgeon: Meredith Pel, MD;  Location: [place];  Service: Orthopedics;  Laterality: Right;   TUMOR REMOVAL  2014   on voice box   WISDOM TOOTH EXTRACTION      Current Medications: Current Meds  Medication Sig   APPLE CIDER VINEGAR PO Take 1,200 mg by mouth every evening.   atorvastatin (LIPITOR) 10 MG tablet Take 10 mg by mouth every morning.   atorvastatin (LIPITOR) 10 MG tablet 1 tablet   etodolac (LODINE XL) 500 MG 24 hr tablet Take 1 tablet by mouth 2 (two) times daily with a meal.   etodolac (LODINE) 500 MG tablet Take 500 mg by mouth 2 (two)  times daily.   ezetimibe (ZETIA) 10 MG tablet Take 10 mg by mouth every morning.   fenofibrate micronized (LOFIBRA) 200 MG capsule Take 200 mg by mouth daily.   folic acid (FOLVITE) 1 MG tablet Take 1 mg by mouth 2 (two) times daily.   glipiZIDE (GLUCOTROL) 5 MG tablet Take 5 mg by mouth daily.   golimumab (SIMPONI ARIA) 50 MG/4ML SOLN injection 2mg /kg   losartan (COZAAR) 100 MG tablet Take 100 mg by mouth daily.   losartan (COZAAR) 25 MG tablet Take 25 mg by mouth daily.    metFORMIN  (GLUCOPHAGE) 1000 MG tablet Take 1,000 mg by mouth 2 (two) times daily with a meal.   methotrexate 50 MG/2ML injection Inject 0.8 mLs (20 mg total) into the skin once a week. (Patient taking differently: Inject 20 mg into the skin every Sunday. Every Sunday)   metoprolol succinate (TOPROL-XL) 50 MG 24 hr tablet Take 1 tablet (50 mg total) by mouth daily. Take with or immediately following a meal.   Misc Natural Products (TART CHERRY ADVANCED PO) Take 1 capsule by mouth every morning.    Omega 3 1200 MG CAPS Take 1,200 mg by mouth daily.    Omega-3 Fatty Acids (FISH OIL) 435 MG CAPS 1 capsule   omeprazole (PRILOSEC) 40 MG capsule Take 40 mg by mouth daily.      Allergies:   Patient has no known allergies.   Social History   Socioeconomic History   Marital status: Married    Spouse name: Not on file   Number of children: Not on file   Years of education: Not on file   Highest education level: Not on file  Occupational History   Not on file  Tobacco Use   Smoking status: Every Day    Packs/day: 1.50    Years: 28.00    Pack years: 42.00    Types: Cigarettes   Smokeless tobacco: Never  Vaping Use   Vaping Use: Never used  Substance and Sexual Activity   Alcohol use: No   Drug use: No   Sexual activity: Not on file  Other Topics Concern   Not on file  Social History Narrative   Not on file   Social Determinants of Health   Financial Resource Strain: Not on file  Food Insecurity: Not on file  Transportation Needs: Not on file  Physical Activity: Not on file  Stress: Not on file  Social Connections: Not on file     Family History: The patient's Sibling deceased in there 23s from stroke. Father - stroke   ROS:   Please see the history of present illness.     All other systems reviewed and are negative.  EKGs/Labs/Other Studies Reviewed:    The following studies were reviewed today:  EKG:  EKG is  ordered today.  The ekg ordered today demonstrates   02/26/2021-  Sinus tachcardia HR 101 bpm, RSR', right axis deviation, RVH  no S1Q3T3 02/02/2021- NSR 05/07/2020 - NSR, LAD, IRBBB  Recent Labs: 05/07/2020: BUN 13; Creatinine, Ser 0.85; Hemoglobin 15.3; Platelets 238; Potassium 4.0; Sodium 137  Recent Lipid Panel No results found for: CHOL, TRIG, HDL, CHOLHDL, VLDL, LDLCALC, LDLDIRECT   Risk Assessment/Calculations:     Physical Exam:    VS:  BP (!) 160/86   Pulse 61   Ht 5\' 10"  (1.778 m)   Wt (!) 383 lb (173.7 kg)   SpO2 96%   BMI 54.95 kg/m     Wt Readings from  Last 3 Encounters:  02/26/21 (!) 383 lb (173.7 kg)  05/11/20 (!) 377 lb 6.4 oz (171.2 kg)  05/07/20 (!) 377 lb 12.8 oz (171.4 kg)     GEN:  Well nourished, well developed in no acute distress. Morbidly obese HEENT: Normal NECK: No JVD; No carotid bruits LYMPHATICS: No lymphadenopathy CARDIAC: RRR, no murmurs, rubs, gallops RESPIRATORY:  Clear to auscultation without rales, wheezing or rhonchi  ABDOMEN: Soft, non-tender, non-distended MUSCULOSKELETAL:  No edema; No deformity  SKIN: Warm and dry NEUROLOGIC:  Alert and oriented x 3 PSYCHIATRIC:  Normal affect   ASSESSMENT:    #Angina: Darren Sweeney symptoms are more progressive.and at times at rest which is concerning. EKG did not show ST-T changes.  He is high risk with DMII (A1c 8.7 2021), smoking, family hx and pretest probability is high for coronary disease. Scheduling cath for next week. Counseled him and his wife that if symptoms progress will need to go to the ED.  I counseled him on stopping smoking, diet and weighloss. He has good 2+ radial pulse, normal creatinine in 2021, pending repeat labs. Will also plan for an echo cardiogram. After his cath will work on secondary prevention if he has a lesion including lipid profile, more smoking cessation, weight loss.  #Hypertension: Poorly controlled , will started metoprolol succinate 50mg  XL. His potassium came back at 5.5, changed losartan 100 mg to norvasc 10 mg  daily   PLAN:    In order of problems listed above:  LHC right radial Start metroprolol XL 50 mg daily Echocardiogram complete Follow up in 3 months    Medication Adjustments/Labs and Tests Ordered: Current medicines are reviewed at length with the patient today.  Concerns regarding medicines are outlined above.   Signed, Janina Mayo, MD  02/26/2021 8:33 AM    [facility]

## 2021-02-26 ENCOUNTER — Other Ambulatory Visit: Payer: Self-pay

## 2021-02-26 ENCOUNTER — Encounter: Payer: Self-pay | Admitting: Internal Medicine

## 2021-02-26 ENCOUNTER — Ambulatory Visit: Payer: BC Managed Care – PPO | Admitting: Internal Medicine

## 2021-02-26 VITALS — BP 160/86 | HR 61 | Ht 70.0 in | Wt 383.0 lb

## 2021-02-26 DIAGNOSIS — R072 Precordial pain: Secondary | ICD-10-CM | POA: Diagnosis not present

## 2021-02-26 DIAGNOSIS — I10 Essential (primary) hypertension: Secondary | ICD-10-CM

## 2021-02-26 DIAGNOSIS — R002 Palpitations: Secondary | ICD-10-CM | POA: Diagnosis not present

## 2021-02-26 MED ORDER — SODIUM CHLORIDE 0.9% FLUSH: 3.0000 mL | Freq: Two times a day (BID) | INTRAVENOUS | Status: DC

## 2021-02-26 MED ORDER — METOPROLOL SUCCINATE ER 50 MG PO TB24: 50.0000 mg | ORAL_TABLET | Freq: Every day | ORAL | 3 refills | Status: DC

## 2021-02-26 MED ORDER — AMLODIPINE BESYLATE 10 MG PO TABS: 10.0000 mg | ORAL_TABLET | Freq: Every day | ORAL | 3 refills | Status: DC

## 2021-02-26 NOTE — Addendum Note (Signed)
Addended byPhineas Inches on: 02/26/2021 04:52 PM   Modules accepted: Orders

## 2021-02-26 NOTE — Patient Instructions (Signed)
  [address] [place] [address] Dept: [phone] Loc: [phone]  Elliot Meldrum  02/26/2021  You are scheduled for a Cardiac Catheterization on Tuesday, October 18 with Dr. Daneen Schick.  1. Please arrive at the [facility] (Main Entrance A) at [facility]: [address] [place], [place] [address] at 5:30 AM (This time is two hours before your procedure to ensure your preparation). Free valet parking service is available.   Special note: Every effort is made to have your procedure done on time. Please understand that emergencies sometimes delay scheduled procedures.  2. Diet: Do not eat solid foods after midnight.  The patient may have clear liquids until 5am upon the day of the procedure.  3. Labs: today  4. Medication instructions in preparation for your procedure:  START METORPOLOL XL 50 MG ONCE A DAY  DO NOT TAKE GLIPIZIDE THE MORNING OF THE PROCEDURE  DO NOT TAKE METFORMIN Tuesday, Wednesday, OR THURSDAY. RESTART ON FRIDAY  On the morning of your procedure, take morning medicines NOT listed above.  You may use sips of water.  5. Plan for one night stay--bring personal belongings. 6. Bring a current list of your medications and current insurance cards. 7. You MUST have a responsible person to drive you home. 8. Someone MUST be with you the first 24 hours after you arrive home or your discharge will be delayed. 9. Please wear clothes that are easy to get on and off and wear slip-on shoes.  Thank you for allowing [place] to care for you!   -- [facility]   Your physician has requested that you have an echocardiogram. Echocardiography is a painless test that uses sound waves to create images of your heart. It provides your doctor with information about the size and shape of your heart and how well your heart's chambers and valves are  working. This procedure takes approximately one hour. There are no restrictions for this procedure. [place]  Your physician recommends that you schedule a follow-up appointment in: [place] DR. BRANCH

## 2021-02-27 LAB — BASIC METABOLIC PANEL
BUN/Creatinine Ratio: 16 (ref 9–20)
BUN: 13 mg/dL (ref 6–24)
CO2: 24 mmol/L (ref 20–29)
Calcium: 10.3 mg/dL — ABNORMAL HIGH (ref 8.7–10.2)
Chloride: 101 mmol/L (ref 96–106)
Creatinine, Ser: 0.83 mg/dL (ref 0.76–1.27)
Glucose: 164 mg/dL — ABNORMAL HIGH (ref 70–99)
Potassium: 5.5 mmol/L — ABNORMAL HIGH (ref 3.5–5.2)
Sodium: 140 mmol/L (ref 134–144)
eGFR: 107 mL/min/{1.73_m2} (ref >59)

## 2021-02-27 LAB — CBC
Hematocrit: 47.7 % (ref 37.5–51.0)
Hemoglobin: 16.2 g/dL (ref 13.0–17.7)
MCH: 28.2 pg (ref 26.6–33.0)
MCHC: 34 g/dL (ref 31.5–35.7)
MCV: 83 fL (ref 79–97)
Platelets: 276 10*3/uL (ref 150–450)
RBC: 5.74 x10E6/uL (ref 4.14–5.80)
RDW: 13.4 % (ref 11.6–15.4)
WBC: 11.2 10*3/uL — ABNORMAL HIGH (ref 3.4–10.8)

## 2021-03-01 ENCOUNTER — Telehealth: Payer: Self-pay | Admitting: *Deleted

## 2021-03-01 NOTE — H&P (Addendum)
Multiple risk factors including tobacco use, diabetes mellitus II, hypertension, hyperlipidemia, family history, and recurring chest discomfort at rest and with exertion.  EKG tall R wave V1, extreme right axis deviation, poor R wave progression, without other abnormalities.  Left heart cath with coronary angiogram possible PCI appropriate.  Potassium 5.5 needs to be repeated.

## 2021-03-01 NOTE — Telephone Encounter (Signed)
Cardiac catheterization scheduled at [facility] for: Tuesday March 02, 2021 7:30 [facility] Main Entrance A [facility]) at: 5:30 AM   No solid food after midnight prior to cath, clear liquids until 5 AM day of procedure.  Medication instructions: Hold: Metformin-day of procedure and 48 hours post procedure  Glipizide-AM of procedure   Except hold medications usual morning medications can be taken pre-cath with sips of water including aspirin 81 mg.    Confirmed patient has responsible adult to drive home post procedure and be with patient first 24 hours after arriving home.  [facility] does allow one visitor to accompany you and wait in the hospital waiting room while you are there for your procedure. You and your visitor will be asked to wear a mask once you enter the hospital.   Patient reports does not currently have any symptoms concerning for COVID-19 and no household members with COVID-19 like illness.      Reviewed procedure/mask/visitor instructions with patient.

## 2021-03-02 ENCOUNTER — Other Ambulatory Visit: Payer: Self-pay | Admitting: Internal Medicine

## 2021-03-02 ENCOUNTER — Ambulatory Visit (HOSPITAL_COMMUNITY)
Admission: RE | Admit: 2021-03-02 | Discharge: 2021-03-02 | Disposition: A | Discharge status: Home / Self Care | Payer: BC Managed Care – PPO | Source: Ambulatory Visit | Attending: Interventional Cardiology | Admitting: Interventional Cardiology

## 2021-03-02 ENCOUNTER — Encounter (HOSPITAL_COMMUNITY)
Admission: RE | Disposition: A | Discharge status: Home / Self Care | Payer: Self-pay | Source: Ambulatory Visit | Attending: Interventional Cardiology

## 2021-03-02 ENCOUNTER — Other Ambulatory Visit: Payer: Self-pay

## 2021-03-02 ENCOUNTER — Encounter (HOSPITAL_COMMUNITY): Payer: Self-pay | Admitting: Interventional Cardiology

## 2021-03-02 DIAGNOSIS — Z7984 Long term (current) use of oral hypoglycemic drugs: Secondary | ICD-10-CM | POA: Insufficient documentation

## 2021-03-02 DIAGNOSIS — Z6841 Body Mass Index (BMI) 40.0 and over, adult: Secondary | ICD-10-CM | POA: Insufficient documentation

## 2021-03-02 DIAGNOSIS — E785 Hyperlipidemia, unspecified: Secondary | ICD-10-CM | POA: Diagnosis not present

## 2021-03-02 DIAGNOSIS — I5032 Chronic diastolic (congestive) heart failure: Secondary | ICD-10-CM | POA: Diagnosis not present

## 2021-03-02 DIAGNOSIS — I110 Hypertensive heart disease with heart failure: Secondary | ICD-10-CM | POA: Diagnosis not present

## 2021-03-02 DIAGNOSIS — E119 Type 2 diabetes mellitus without complications: Secondary | ICD-10-CM | POA: Diagnosis not present

## 2021-03-02 DIAGNOSIS — R0789 Other chest pain: Secondary | ICD-10-CM

## 2021-03-02 DIAGNOSIS — G4730 Sleep apnea, unspecified: Secondary | ICD-10-CM | POA: Insufficient documentation

## 2021-03-02 DIAGNOSIS — R0609 Other forms of dyspnea: Secondary | ICD-10-CM | POA: Insufficient documentation

## 2021-03-02 DIAGNOSIS — R079 Chest pain, unspecified: Secondary | ICD-10-CM | POA: Diagnosis not present

## 2021-03-02 DIAGNOSIS — Z79899 Other long term (current) drug therapy: Secondary | ICD-10-CM | POA: Insufficient documentation

## 2021-03-02 DIAGNOSIS — F17210 Nicotine dependence, cigarettes, uncomplicated: Secondary | ICD-10-CM | POA: Diagnosis not present

## 2021-03-02 DIAGNOSIS — Z8249 Family history of ischemic heart disease and other diseases of the circulatory system: Secondary | ICD-10-CM | POA: Diagnosis not present

## 2021-03-02 DIAGNOSIS — I25119 Atherosclerotic heart disease of native coronary artery with unspecified angina pectoris: Secondary | ICD-10-CM | POA: Diagnosis not present

## 2021-03-02 DIAGNOSIS — R072 Precordial pain: Secondary | ICD-10-CM

## 2021-03-02 DIAGNOSIS — R002 Palpitations: Secondary | ICD-10-CM

## 2021-03-02 DIAGNOSIS — Z720 Tobacco use: Secondary | ICD-10-CM | POA: Diagnosis present

## 2021-03-02 DIAGNOSIS — G4733 Obstructive sleep apnea (adult) (pediatric): Secondary | ICD-10-CM | POA: Diagnosis present

## 2021-03-02 HISTORY — PX: LEFT HEART CATH AND CORONARY ANGIOGRAPHY: CATH118249

## 2021-03-02 LAB — BASIC METABOLIC PANEL
Anion gap: 10 (ref 5–15)
BUN: 15 mg/dL (ref 6–20)
CO2: 22 mmol/L (ref 22–32)
Calcium: 8.8 mg/dL — ABNORMAL LOW (ref 8.9–10.3)
Chloride: 101 mmol/L (ref 98–111)
Creatinine, Ser: 0.88 mg/dL (ref 0.61–1.24)
GFR, Estimated: >60 mL/min (ref >60)
Glucose, Bld: 215 mg/dL — ABNORMAL HIGH (ref 70–99)
Potassium: 4.5 mmol/L (ref 3.5–5.1)
Sodium: 133 mmol/L — ABNORMAL LOW (ref 135–145)

## 2021-03-02 LAB — GLUCOSE, CAPILLARY: Glucose-Capillary: 199 mg/dL — ABNORMAL HIGH (ref 70–99)

## 2021-03-02 SURGERY — LEFT HEART CATH AND CORONARY ANGIOGRAPHY
Anesthesia: LOCAL

## 2021-03-02 MED ORDER — OXYCODONE HCL 5 MG PO TABS: 5.0000 mg | ORAL_TABLET | ORAL | Status: DC | PRN

## 2021-03-02 MED ORDER — HEPARIN SODIUM (PORCINE) 1000 UNIT/ML IJ SOLN
INTRAMUSCULAR | Status: AC
  Filled 2021-03-02: qty 1

## 2021-03-02 MED ORDER — SODIUM CHLORIDE 0.9 % IV SOLN: 250.0000 mL | INTRAVENOUS | Status: DC | PRN

## 2021-03-02 MED ORDER — VERAPAMIL HCL 2.5 MG/ML IV SOLN
INTRAVENOUS | Status: AC
  Filled 2021-03-02: qty 2

## 2021-03-02 MED ORDER — LABETALOL HCL 5 MG/ML IV SOLN: 10.0000 mg | INTRAVENOUS | Status: DC | PRN

## 2021-03-02 MED ORDER — EMPAGLIFLOZIN 10 MG PO TABS: 10.0000 mg | ORAL_TABLET | Freq: Every day | ORAL | 3 refills | Status: DC

## 2021-03-02 MED ORDER — FENTANYL CITRATE (PF) 100 MCG/2ML IJ SOLN
INTRAMUSCULAR | Status: DC | PRN
  Administered 2021-03-02: 25 ug via INTRAVENOUS

## 2021-03-02 MED ORDER — HEPARIN (PORCINE) IN NACL 1000-0.9 UT/500ML-% IV SOLN
INTRAVENOUS | Status: AC
  Filled 2021-03-02: qty 1000

## 2021-03-02 MED ORDER — ACETAMINOPHEN 325 MG PO TABS: 650.0000 mg | ORAL_TABLET | ORAL | Status: DC | PRN

## 2021-03-02 MED ORDER — SODIUM CHLORIDE 0.9% FLUSH: 3.0000 mL | INTRAVENOUS | Status: DC | PRN

## 2021-03-02 MED ORDER — SODIUM CHLORIDE 0.9 % IV SOLN: INTRAVENOUS | Status: DC

## 2021-03-02 MED ORDER — MIDAZOLAM HCL 2 MG/2ML IJ SOLN
INTRAMUSCULAR | Status: AC
  Filled 2021-03-02: qty 2

## 2021-03-02 MED ORDER — HEPARIN SODIUM (PORCINE) 1000 UNIT/ML IJ SOLN
INTRAMUSCULAR | Status: DC | PRN
  Administered 2021-03-02: 6000 [IU] via INTRAVENOUS

## 2021-03-02 MED ORDER — MIDAZOLAM HCL 2 MG/2ML IJ SOLN
INTRAMUSCULAR | Status: DC | PRN
  Administered 2021-03-02: 1 mg via INTRAVENOUS

## 2021-03-02 MED ORDER — VERAPAMIL HCL 2.5 MG/ML IV SOLN
INTRAVENOUS | Status: DC | PRN
  Administered 2021-03-02: 10 mL via INTRA_ARTERIAL

## 2021-03-02 MED ORDER — ASPIRIN 81 MG PO CHEW: 81.0000 mg | CHEWABLE_TABLET | ORAL | Status: DC

## 2021-03-02 MED ORDER — ONDANSETRON HCL 4 MG/2ML IJ SOLN: 4.0000 mg | Freq: Four times a day (QID) | INTRAMUSCULAR | Status: DC | PRN

## 2021-03-02 MED ORDER — SODIUM CHLORIDE 0.9 % WEIGHT BASED INFUSION
3.0000 mL/kg/h | INTRAVENOUS | Status: AC
  Administered 2021-03-02: 3 mL/kg/h via INTRAVENOUS

## 2021-03-02 MED ORDER — LIDOCAINE HCL (PF) 1 % IJ SOLN
INTRAMUSCULAR | Status: AC
  Filled 2021-03-02: qty 30

## 2021-03-02 MED ORDER — HEPARIN (PORCINE) IN NACL 1000-0.9 UT/500ML-% IV SOLN
INTRAVENOUS | Status: DC | PRN
  Administered 2021-03-02 (×2): 500 mL

## 2021-03-02 MED ORDER — SODIUM CHLORIDE 0.9% FLUSH: 3.0000 mL | Freq: Two times a day (BID) | INTRAVENOUS | Status: DC

## 2021-03-02 MED ORDER — SODIUM CHLORIDE 0.9 % WEIGHT BASED INFUSION: 1.0000 mL/kg/h | INTRAVENOUS | Status: DC

## 2021-03-02 MED ORDER — HYDRALAZINE HCL 20 MG/ML IJ SOLN: 10.0000 mg | INTRAMUSCULAR | Status: DC | PRN

## 2021-03-02 MED ORDER — FENTANYL CITRATE (PF) 100 MCG/2ML IJ SOLN
INTRAMUSCULAR | Status: AC
  Filled 2021-03-02: qty 2

## 2021-03-02 MED ORDER — LIDOCAINE HCL (PF) 1 % IJ SOLN
INTRAMUSCULAR | Status: DC | PRN
  Administered 2021-03-02: 2 mL

## 2021-03-02 MED ORDER — IOHEXOL 350 MG/ML SOLN
INTRAVENOUS | Status: DC | PRN
  Administered 2021-03-02: 75 mL via INTRA_ARTERIAL

## 2021-03-02 SURGICAL SUPPLY — 10 items
CATH 5FR JL3.5 JR4 ANG PIG MP (CATHETERS) ×1 IMPLANT
DEVICE RAD COMP TR BAND LRG (VASCULAR PRODUCTS) ×1 IMPLANT
GLIDESHEATH SLEND A-KIT 6F 22G (SHEATH) ×1 IMPLANT
GUIDEWIRE INQWIRE 1.5J.035X260 (WIRE) IMPLANT
INQWIRE 1.5J .035X260CM (WIRE) ×2
KIT HEART LEFT (KITS) ×2 IMPLANT
PACK CARDIAC CATHETERIZATION (CUSTOM PROCEDURE TRAY) ×2 IMPLANT
SHEATH PROBE COVER 6X72 (BAG) ×1 IMPLANT
TRANSDUCER W/STOPCOCK (MISCELLANEOUS) ×2 IMPLANT
TUBING CIL FLEX 10 FLL-RA (TUBING) ×2 IMPLANT

## 2021-03-02 NOTE — Progress Notes (Signed)
Pt ambulated without difficulty or bleeding.   Discharged home with his wife who will drive and stay with pt x 24 hrs. 

## 2021-03-02 NOTE — Discharge Instructions (Signed)
Radial Site Care  This sheet gives you information about how to care for yourself after your procedure. Your health care provider may also give you more specific instructions. If you have problems or questions, contact your health care provider. What can I expect after the procedure? After the procedure, it is common to have: Bruising and tenderness at the catheter insertion area. Follow these instructions at home: Medicines Take over-the-counter and prescription medicines only as told by your health care provider. Insertion site care Follow instructions from your health care provider about how to take care of your insertion site. Make sure you: Wash your hands with soap and water before you remove your bandage (dressing). If soap and water are not available, use hand sanitizer. May remove dressing in 24 hours. Check your insertion site every day for signs of infection. Check for: Redness, swelling, or pain. Fluid or blood. Pus or a bad smell. Warmth. Do no take baths, swim, or use a hot tub for 5 days. You may shower 24-48 hours after the procedure. Remove the dressing and gently wash the site with plain soap and water. Pat the area dry with a clean towel. Do not rub the site. That could cause bleeding. Do not apply powder or lotion to the site. Activity  For 24 hours after the procedure, or as directed by your health care provider: Do not flex or bend the affected arm. Do not push or pull heavy objects with the affected arm. Do not drive yourself home from the hospital or clinic. You may drive 24 hours after the procedure. Do not operate machinery or power tools. KEEP ARM ELEVATED THE REMAINDER OF THE DAY. Do not push, pull or lift anything that is heavier than 10 lb for 5 days. Ask your health care provider when it is okay to: Return to work or school. Resume usual physical activities or sports. Resume sexual activity. General instructions If the catheter site starts to  bleed, raise your arm and put firm pressure on the site. If the bleeding does not stop, get help right away. This is a medical emergency. DRINK PLENTY OF FLUIDS FOR THE NEXT 2-3 DAYS. No alcohol consumption for 24 hours after receiving sedation. If you went home on the same day as your procedure, a responsible adult should be with you for the first 24 hours after you arrive home. Keep all follow-up visits as told by your health care provider. This is important. Contact a health care provider if: You have a fever. You have redness, swelling, or yellow drainage around your insertion site. Get help right away if: You have unusual pain at the radial site. The catheter insertion area swells very fast. The insertion area is bleeding, and the bleeding does not stop when you hold steady pressure on the area. Your arm or hand becomes pale, cool, tingly, or numb. These symptoms may represent a serious problem that is an emergency. Do not wait to see if the symptoms will go away. Get medical help right away. Call your local emergency services (911 in the [place] Do not drive yourself to the hospital. Summary After the procedure, it is common to have bruising and tenderness at the site. Follow instructions from your health care provider about how to take care of your radial site wound. Check the wound every day for signs of infection.  This information is not intended to replace advice given to you by your health care provider. Make sure you discuss any questions you have with   your health care provider. Document Revised: 06/07/2017 Document Reviewed: 06/07/2017 Elsevier Patient Education  2020 Elsevier Inc.  

## 2021-03-02 NOTE — Interval H&P Note (Signed)
Cath Lab Visit (complete for each Cath Lab visit)  Clinical Evaluation Leading to the Procedure:   ACS: No.  Non-ACS:    Anginal Classification: CCS III  Anti-ischemic medical therapy: Maximal Therapy (2 or more classes of medications)  Non-Invasive Test Results: No non-invasive testing performed  Prior CABG: No previous CABG      History and Physical Interval Note:  03/02/2021 7:22 AM  Darren Sweeney  has presented today for surgery, with the diagnosis of chest pain.  The various methods of treatment have been discussed with the patient and family. After consideration of risks, benefits and other options for treatment, the patient has consented to  Procedure(s): LEFT HEART CATH AND CORONARY ANGIOGRAPHY (N/A) as a surgical intervention.  The patient's history has been reviewed, patient examined, no change in status, stable for surgery.  I have reviewed the patient's chart and labs.  Questions were answered to the patient's satisfaction.     [name]

## 2021-03-02 NOTE — Progress Notes (Signed)
Please let Mr Kauffmann and his wife know that I am prescribing a medication recommended after his heart catheterization, empagliflozin to reduce heart disease related death. If it is cost prohibitive, it is understandable and he can choose to not fill the prescription. Can work on a solution if this occurs in terms of cost.

## 2021-03-02 NOTE — Progress Notes (Deleted)
Please let Darren Sweeney and his wife know that I am prescribing a medication recommended after his heart catheterization, empagliflozin to reduce heart disease related death. If it is cost prohibitive, it is understandable and he can choose to not fill the prescription. Can work on a solution if this occurs in terms of cost.

## 2021-03-02 NOTE — CV Procedure (Signed)
Normal coronary arteries. LVEF 50% with LVEDP 24 mmHg c/w diastolic HF.

## 2021-03-03 NOTE — Progress Notes (Addendum)
Left message for pt to call.

## 2021-03-04 ENCOUNTER — Telehealth: Payer: Self-pay | Admitting: Internal Medicine

## 2021-03-04 NOTE — Telephone Encounter (Signed)
Will forward to Dr Harl Bowie for review and recommendations./cy

## 2021-03-04 NOTE — Telephone Encounter (Signed)
New message:   Wife said Dr Harl Bowie said something about seeing a Pulmonary doctor. She wants to know   if Dr Harl Bowie is going to refer him to one?  Also wants to know about his diet, what does he need to do do for his diet?

## 2021-03-08 ENCOUNTER — Telehealth: Payer: Self-pay | Admitting: Internal Medicine

## 2021-03-08 MED ORDER — EMPAGLIFLOZIN 10 MG PO TABS: 10.0000 mg | ORAL_TABLET | Freq: Every day | ORAL | 3 refills | Status: AC

## 2021-03-08 NOTE — Progress Notes (Signed)
Spoke with pt, aware of dr branch's recommendations. New script sent to the pharmacy, samples and savings card placed at the front desk for pick up.

## 2021-03-08 NOTE — Telephone Encounter (Signed)
Patient's wife states that Dr. Harl Bowie said that she could not place a referral to Pulmonology, that it had to be done through his PCP. She contacted his PCP's office and they stated that they need more information on why he is being referred there. The wife states that they were told he needed to schedule with pulmonology ASAP so she is very concerned. Patient's wife states that either a referral needs to be placed ASAP to pulmonology or his last OV notes stating why he needs to see pulmonology faxed to his PCP's office.

## 2021-03-08 NOTE — Telephone Encounter (Signed)
Routed to MD/RN - unsure if can be added to MD note, so that this can be routed to PCP re: referral needed

## 2021-03-08 NOTE — Telephone Encounter (Signed)
Left message to call back to discuss note from MD below

## 2021-03-08 NOTE — Telephone Encounter (Signed)
Janina Mayo, MD  You 43 minutes ago (1:51 PM)   MB This would be up to his primary care doctor. I ruled out heart disease. He can ask his primary care at his next visit if seeing a lung doctor is necessary    You  Branch, Royetta Crochet, MD; Clinton Gallant, Belinda Block, RN 1 hour ago (1:30 PM)   Hi Darren Sweeney - I saw you had messaged patient already, but I guess mentioning pulm in the note will help PCP with the referral. Thanks

## 2021-03-08 NOTE — Telephone Encounter (Signed)
Spoke with patient's wife and explained Dr. Harl Bowie was not going to do pulmonary referral but suggested he talk with PCP. She said Dr. Tamala Julian mentioned this, post cath. Per report, he should have OSA managed. Wife states he has a CPAP hat is 49 years old and used to be seen with [facility] but was not satisfied. Advised to check with insurance to see if referral is required and if not, to schedule with [facility].  She also said patient got a message from Dr. Harl Bowie today about jardiance Rx. She said she picked up Rx last week (per 10/18 note, Debra RN sent in Rx) and has been taking. Advised OK to come by and pick up samples/savings card that Hilda Blades also left.

## 2021-03-08 NOTE — Addendum Note (Signed)
Addended by: Cristopher Estimable on: 03/08/2021 01:42 PM   Modules accepted: Orders

## 2021-03-09 DIAGNOSIS — M0579 Rheumatoid arthritis with rheumatoid factor of multiple sites without organ or systems involvement: Secondary | ICD-10-CM | POA: Diagnosis not present

## 2021-03-09 DIAGNOSIS — Z79899 Other long term (current) drug therapy: Secondary | ICD-10-CM | POA: Diagnosis not present

## 2021-03-12 DIAGNOSIS — G4733 Obstructive sleep apnea (adult) (pediatric): Secondary | ICD-10-CM | POA: Diagnosis not present

## 2021-03-25 DIAGNOSIS — J209 Acute bronchitis, unspecified: Secondary | ICD-10-CM | POA: Diagnosis not present

## 2021-03-25 DIAGNOSIS — M791 Myalgia, unspecified site: Secondary | ICD-10-CM | POA: Diagnosis not present

## 2021-03-25 DIAGNOSIS — Z20822 Contact with and (suspected) exposure to covid-19: Secondary | ICD-10-CM | POA: Diagnosis not present

## 2021-03-25 DIAGNOSIS — Z03818 Encounter for observation for suspected exposure to other biological agents ruled out: Secondary | ICD-10-CM | POA: Diagnosis not present

## 2021-03-29 ENCOUNTER — Other Ambulatory Visit: Payer: Self-pay

## 2021-03-29 ENCOUNTER — Ambulatory Visit (HOSPITAL_COMMUNITY): Payer: BC Managed Care – PPO | Attending: Internal Medicine

## 2021-03-29 DIAGNOSIS — R072 Precordial pain: Secondary | ICD-10-CM | POA: Diagnosis not present

## 2021-03-29 DIAGNOSIS — R002 Palpitations: Secondary | ICD-10-CM | POA: Diagnosis not present

## 2021-03-29 LAB — ECHOCARDIOGRAM COMPLETE
Area-P 1/2: 3.99 cm2
S' Lateral: 2.75 cm

## 2021-03-30 DIAGNOSIS — E119 Type 2 diabetes mellitus without complications: Secondary | ICD-10-CM | POA: Diagnosis not present

## 2021-04-29 DIAGNOSIS — G4733 Obstructive sleep apnea (adult) (pediatric): Secondary | ICD-10-CM | POA: Diagnosis not present

## 2021-04-29 DIAGNOSIS — Z6841 Body Mass Index (BMI) 40.0 and over, adult: Secondary | ICD-10-CM | POA: Diagnosis not present

## 2021-04-29 DIAGNOSIS — F17210 Nicotine dependence, cigarettes, uncomplicated: Secondary | ICD-10-CM | POA: Diagnosis not present

## 2021-05-04 DIAGNOSIS — M0579 Rheumatoid arthritis with rheumatoid factor of multiple sites without organ or systems involvement: Secondary | ICD-10-CM | POA: Diagnosis not present

## 2021-05-04 DIAGNOSIS — Z79899 Other long term (current) drug therapy: Secondary | ICD-10-CM | POA: Diagnosis not present

## 2021-05-24 ENCOUNTER — Other Ambulatory Visit: Payer: Self-pay

## 2021-05-24 ENCOUNTER — Ambulatory Visit: Payer: BC Managed Care – PPO | Admitting: Internal Medicine

## 2021-05-24 ENCOUNTER — Encounter: Payer: Self-pay | Admitting: Internal Medicine

## 2021-05-24 VITALS — BP 134/78 | HR 96 | Ht 70.0 in | Wt 377.2 lb

## 2021-05-24 DIAGNOSIS — I5032 Chronic diastolic (congestive) heart failure: Secondary | ICD-10-CM

## 2021-05-24 MED ORDER — FUROSEMIDE 20 MG PO TABS: 20.0000 mg | ORAL_TABLET | Freq: Every day | ORAL | 5 refills | Status: DC

## 2021-05-24 MED ORDER — EMPAGLIFLOZIN 10 MG PO TABS: 10.0000 mg | ORAL_TABLET | Freq: Every day | ORAL | 5 refills | Status: DC

## 2021-05-24 NOTE — Patient Instructions (Signed)
Medication Instructions:  START: JARDIANCE 10 ONCE DAILY  START: LASIX 20mg  ONCE DAILY  *If you need a refill on your cardiac medications before your next appointment, please call your pharmacy*  Lab Work: BNP, A1C, LIPID PANEL- TODAY  If you have labs (blood work) drawn today and your tests are completely normal, you will receive your results only by: [place] (if you have MyChart) OR A paper copy in the mail If you have any lab test that is abnormal or we need to change your treatment, we will call you to review the results.  Follow-Up: At [facility], you and your health needs are our priority.  As part of our continuing mission to provide you with exceptional heart care, we have created designated Provider Care Teams.  These Care Teams include your primary Cardiologist (physician) and Advanced Practice Providers (APPs -  Physician Assistants and Nurse Practitioners) who all work together to provide you with the care you need, when you need it.  Your next appointment:   6 month(s)  The format for your next appointment:   In Person  Provider:   Janina Mayo, MD

## 2021-05-24 NOTE — Progress Notes (Signed)
Cardiology Office Note:    Date:  05/24/2021   ID:  Darren Sweeney, DOB May 27, 1970, MRN [id number]  PCP:  [facility], [place] Providers Cardiologist:  None     Referring MD: [facility]*   No chief complaint on file. Chest pain  History of Present Illness:    Darren Sweeney is a 51 y.o. male with a hx of obesity, hypertension, smoker,  DMII A1c 8.7, no CVD hx, cardiology referral from rheumatologist 2/2 progressive chest pain  Darren Sweeney states that for a year he's had sternal chest pain with activity. This has progressed and he in the AM he notes a sharp pain for 5 minutes that improves with calming down. He has progressive dyspnea on exertion. He is morbidly obese. He is an active smoker for the last 30 years. He has strong family hx of early stroke, noted in siblings deceased in their 63s. His blood pressure has gotten worse, now typically in the 170s SBP. His losartan was just increased from 25 to 100 mg. His blood glucose has been higher lately as well. His wife is concerned he has coronary disease.  CVD Risk/Equivalent: HLD- on atorvastatin 10 mg HTN- yes, poorly controlled PAD- None DMII - Yes, not well controlled A1c 8.7 Smoker- yes Family Hx- yes, stroke  Interim Hx: Darren Sweeney was recommended for LHC with anginal symptoms. He was found to have patent coronaries. LVEDP was 24 mmHg c/w diastolic CHF. He states that he gets short of breath with anything strenuous. A short walk. He is pending a new CPAP machine. He understands that his weight is contributing. 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 The nicotine patch does not help. He has normal renal functoin  Cardiology Studies LHC 03/02/2021 CONCLUSIONS: LVEDP 24 mmHg.  Low normal systolic function.  Findings are compatible with chronic diastolic heart failure. Right dominant coronary anatomy.  Widely patent coronaries with no evidence  of obstructive disease.  Minimal luminal irregularities are noted in the right coronary and LAD.   RECOMMENDATIONS: Management of chronic diastolic heart failure to include SGLT2 therapy, management of sleep apnea, weight loss, aerobic exercise, smoking cessation, and control of diabetes mellitus.  TTE 03/02/2021 Left ventricular ejection fraction, by estimation, is 60 to 65%. Left ventricular ejection fraction by 3D volume is 61 %. The left ventricle has normal function. The left ventricle has no regional wall motion abnormalities. Left ventricular diastolic parameters were normal. 1.Right ventricular systolic function is normal. The right ventricular size is mildly enlarged. Tricuspid regurgitation signal is inadequate for assessing PA pressure. 2. 3. The mitral valve is grossly normal. Trivial mitral valve regurgitation. 4. The aortic valve is tricuspid. Aortic valve regurgitation is not visualized. Aortic dilatation noted. There is mild dilatation of the ascending aorta, measuring 40 mm. 5. The inferior vena cava is normal in size with greater than 50% respiratory variability, suggesting right atrial pressure of 3 mmHg.  Past Medical History:  Diagnosis Date   Deafness, left    Diabetes mellitus without complication (HCC)    GERD (gastroesophageal reflux disease)    Hypertension    RA (rheumatoid arthritis) (HCC)    Shortness of breath dyspnea    on exertion   Sleep apnea    Wears glasses     Past Surgical History:  Procedure Laterality Date   BIOPSY  02/06/2020   Procedure: BIOPSY;  Surgeon: Otis Brace, MD;  Location: [facility];  Service: Gastroenterology;;  EGD and COLON   COLONOSCOPY WITH PROPOFOL N/A 02/06/2020   Procedure: COLONOSCOPY WITH PROPOFOL;  Surgeon: Otis Brace, MD;  Location: [facility];  Service: Gastroenterology;  Laterality: N/A;   ESOPHAGOGASTRODUODENOSCOPY (EGD) WITH PROPOFOL N/A 02/06/2020   Procedure: ESOPHAGOGASTRODUODENOSCOPY (EGD)  WITH PROPOFOL;  Surgeon: Otis Brace, MD;  Location: [facility];  Service: Gastroenterology;  Laterality: N/A;   EVALUATION UNDER ANESTHESIA WITH HEMORRHOIDECTOMY N/A 05/11/2020   Procedure: HEMORRHOIDECTOMY WITH LIGATION AND HEMORRHOIDOPEXY ANORECTAL EXAMINATION UNDER ANESTHESIA;  Surgeon: Michael Boston, MD;  Location: [facility];  Service: General;  Laterality: N/A;   FINGER AMPUTATION Left    ring finger amputation   LEFT HEART CATH AND CORONARY ANGIOGRAPHY N/A 03/02/2021   Procedure: LEFT HEART CATH AND CORONARY ANGIOGRAPHY;  Surgeon: Belva Crome, MD;  Location: [facility];  Service: Cardiovascular;  Laterality: N/A;   POLYPECTOMY  02/06/2020   Procedure: POLYPECTOMY;  Surgeon: Otis Brace, MD;  Location: [facility];  Service: Gastroenterology;;  EGD and COLON   SHOULDER ARTHROSCOPY WITH DISTAL CLAVICLE RESECTION Right 05/07/2015   Procedure: SHOULDER DIAGNOSTIC OPERATIVE ARTHROSCOPY WITH DEBRIDEMENT AND DISTAL CLAVICLE EXCISION;  Surgeon: Meredith Pel, MD;  Location: [facility];  Service: Orthopedics;  Laterality: Right;   TUMOR REMOVAL  2014   on voice box   WISDOM TOOTH EXTRACTION      Current Medications: No outpatient medications have been marked as taking for the 05/24/21 encounter (Appointment) with Janina Mayo, MD.     Allergies:   Patient has no known allergies.   Social History   Socioeconomic History   Marital status: Married    Spouse name: Not on file   Number of children: Not on file   Years of education: Not on file   Highest education level: Not on file  Occupational History   Not on file  Tobacco Use   Smoking status: Every Day    Packs/day: 1.50    Years: 28.00    Pack years: 42.00    Types: Cigarettes   Smokeless tobacco: Never  Vaping Use   Vaping Use: Never used  Substance and Sexual Activity   Alcohol use: No   Drug use: No   Sexual activity: Not on file  Other Topics Concern   Not on file  Social History Narrative    Not on file   Social Determinants of Health   Financial Resource Strain: Not on file  Food Insecurity: Not on file  Transportation Needs: Not on file  Physical Activity: Not on file  Stress: Not on file  Social Connections: Not on file     Family History: The patient's Sibling deceased in there 6s from stroke. Father - stroke   ROS:   Please see the history of present illness.     All other systems reviewed and are negative.  EKGs/Labs/Other Studies Reviewed:    The following studies were reviewed today:  EKG:  EKG is  ordered today.  The ekg ordered today demonstrates   02/26/2021- Sinus tachcardia HR 101 bpm, RSR', right axis deviation, RVH  no S1Q3T3 02/02/2021- NSR 05/07/2020 - NSR, LAD, IRBBB  Recent Labs: 02/26/2021: Hemoglobin 16.2; Platelets 276 03/02/2021: BUN 15; Creatinine, Ser 0.88; Potassium 4.5; Sodium 133  Recent Lipid Panel No results found for: CHOL, TRIG, HDL, CHOLHDL, VLDL, LDLCALC, LDLDIRECT   Risk Assessment/Calculations:     Physical Exam:    VS:  There were no vitals taken for this visit.  Wt Readings from Last 3 Encounters:  03/02/21 (!) 383 lb (173.7 kg)  02/26/21 (!) 383 lb (173.7 kg)  05/11/20 (!) 377 lb 6.4 oz (171.2 kg)     GEN:  Well nourished, well developed in no acute distress. Morbidly obese HEENT: moist mucous membranes LYMPHATICS: No lymphadenopathy CARDIAC: RRR, no murmurs, rubs, gallops RESPIRATORY:  Clear to auscultation without rales, wheezing or rhonchi  ABDOMEN: Soft, non-tender, non-distended MUSCULOSKELETAL:  No edema; No deformity  SKIN: Warm and dry NEUROLOGIC:  Alert and oriented x 3 PSYCHIATRIC:  Normal affect   ASSESSMENT:    #Diastolic CHF: p/w chest pain with activity. Had [place] that showed patent CORS. LVEDP 24 mmHg c/f diastolic CHF. He's had no hospital admissions. He has profound obesity with metabolic syndrome. - euvolemic - started lasix 20 mg daily - BNP   #Hypertension: Poorly controlled ,  continue metoprolol succinate 50mg  XL. His potassium came back at 5.5, changed losartan 100 mg to norvasc 10 mg daily - good control, aim for < 130/80 mmHg  #HLD:  LDL 77 08/01/2019 near goal. TC 142. - cont. atorvastatin 10 mg - cont. zetia  #CVD Risk: morbid obesity 54.  DMII and active smoker and family hx of heart disease. Sleep Apnea pending new CPAP.  - recommend A1c surveillance - on  glipizide 5 mg daily - cont empagliflozin 10 mg   PLAN:    In order of problems listed above:  A1c, BNP (baseline) Start lasix 20 mg daily Follow 6 months    Medication Adjustments/Labs and Tests Ordered: Current medicines are reviewed at length with the patient today.  Concerns regarding medicines are outlined above.   Signed, Janina Mayo, MD  05/24/2021 7:53 AM     [facility]

## 2021-05-25 LAB — HEMOGLOBIN A1C
Est. average glucose Bld gHb Est-mCnc: 160 mg/dL
Hgb A1c MFr Bld: 7.2 % — ABNORMAL HIGH (ref 4.8–5.6)

## 2021-05-25 LAB — BRAIN NATRIURETIC PEPTIDE: BNP: 4.1 pg/mL (ref 0.0–100.0)

## 2021-06-01 DIAGNOSIS — I10 Essential (primary) hypertension: Secondary | ICD-10-CM | POA: Diagnosis not present

## 2021-06-01 DIAGNOSIS — G4733 Obstructive sleep apnea (adult) (pediatric): Secondary | ICD-10-CM | POA: Diagnosis not present

## 2021-06-11 DIAGNOSIS — G4733 Obstructive sleep apnea (adult) (pediatric): Secondary | ICD-10-CM | POA: Diagnosis not present

## 2021-06-11 DIAGNOSIS — I10 Essential (primary) hypertension: Secondary | ICD-10-CM | POA: Diagnosis not present

## 2021-06-29 DIAGNOSIS — R5383 Other fatigue: Secondary | ICD-10-CM | POA: Diagnosis not present

## 2021-06-29 DIAGNOSIS — Z79899 Other long term (current) drug therapy: Secondary | ICD-10-CM | POA: Diagnosis not present

## 2021-06-29 DIAGNOSIS — Z111 Encounter for screening for respiratory tuberculosis: Secondary | ICD-10-CM | POA: Diagnosis not present

## 2021-06-29 DIAGNOSIS — M0579 Rheumatoid arthritis with rheumatoid factor of multiple sites without organ or systems involvement: Secondary | ICD-10-CM | POA: Diagnosis not present

## 2021-07-02 DIAGNOSIS — G4733 Obstructive sleep apnea (adult) (pediatric): Secondary | ICD-10-CM | POA: Diagnosis not present

## 2021-07-02 DIAGNOSIS — I10 Essential (primary) hypertension: Secondary | ICD-10-CM | POA: Diagnosis not present

## 2021-07-30 DIAGNOSIS — I10 Essential (primary) hypertension: Secondary | ICD-10-CM | POA: Diagnosis not present

## 2021-07-30 DIAGNOSIS — F17210 Nicotine dependence, cigarettes, uncomplicated: Secondary | ICD-10-CM | POA: Diagnosis not present

## 2021-07-30 DIAGNOSIS — G4733 Obstructive sleep apnea (adult) (pediatric): Secondary | ICD-10-CM | POA: Diagnosis not present

## 2021-07-30 DIAGNOSIS — J432 Centrilobular emphysema: Secondary | ICD-10-CM | POA: Diagnosis not present

## 2021-08-17 ENCOUNTER — Other Ambulatory Visit: Payer: Self-pay

## 2021-08-17 DIAGNOSIS — M25562 Pain in left knee: Secondary | ICD-10-CM | POA: Diagnosis not present

## 2021-08-17 DIAGNOSIS — M0579 Rheumatoid arthritis with rheumatoid factor of multiple sites without organ or systems involvement: Secondary | ICD-10-CM | POA: Diagnosis not present

## 2021-08-17 DIAGNOSIS — Z79899 Other long term (current) drug therapy: Secondary | ICD-10-CM | POA: Diagnosis not present

## 2021-08-17 DIAGNOSIS — M25561 Pain in right knee: Secondary | ICD-10-CM | POA: Diagnosis not present

## 2021-08-17 MED ORDER — FUROSEMIDE 20 MG PO TABS: 20.0000 mg | ORAL_TABLET | Freq: Every day | ORAL | 3 refills | Status: DC

## 2021-08-24 DIAGNOSIS — Z79899 Other long term (current) drug therapy: Secondary | ICD-10-CM | POA: Diagnosis not present

## 2021-08-24 DIAGNOSIS — M0579 Rheumatoid arthritis with rheumatoid factor of multiple sites without organ or systems involvement: Secondary | ICD-10-CM | POA: Diagnosis not present

## 2021-08-30 DIAGNOSIS — I10 Essential (primary) hypertension: Secondary | ICD-10-CM | POA: Diagnosis not present

## 2021-08-30 DIAGNOSIS — G4733 Obstructive sleep apnea (adult) (pediatric): Secondary | ICD-10-CM | POA: Diagnosis not present

## 2021-09-29 DIAGNOSIS — I10 Essential (primary) hypertension: Secondary | ICD-10-CM | POA: Diagnosis not present

## 2021-09-29 DIAGNOSIS — G4733 Obstructive sleep apnea (adult) (pediatric): Secondary | ICD-10-CM | POA: Diagnosis not present

## 2021-10-19 DIAGNOSIS — M0579 Rheumatoid arthritis with rheumatoid factor of multiple sites without organ or systems involvement: Secondary | ICD-10-CM | POA: Diagnosis not present

## 2021-10-19 DIAGNOSIS — Z79899 Other long term (current) drug therapy: Secondary | ICD-10-CM | POA: Diagnosis not present

## 2021-10-30 DIAGNOSIS — I10 Essential (primary) hypertension: Secondary | ICD-10-CM | POA: Diagnosis not present

## 2021-10-30 DIAGNOSIS — G4733 Obstructive sleep apnea (adult) (pediatric): Secondary | ICD-10-CM | POA: Diagnosis not present

## 2021-11-29 DIAGNOSIS — G4733 Obstructive sleep apnea (adult) (pediatric): Secondary | ICD-10-CM | POA: Diagnosis not present

## 2021-11-29 DIAGNOSIS — I10 Essential (primary) hypertension: Secondary | ICD-10-CM | POA: Diagnosis not present

## 2021-12-14 DIAGNOSIS — Z79899 Other long term (current) drug therapy: Secondary | ICD-10-CM | POA: Diagnosis not present

## 2021-12-14 DIAGNOSIS — M0579 Rheumatoid arthritis with rheumatoid factor of multiple sites without organ or systems involvement: Secondary | ICD-10-CM | POA: Diagnosis not present

## 2021-12-23 ENCOUNTER — Ambulatory Visit: Payer: BC Managed Care – PPO | Admitting: Internal Medicine

## 2021-12-23 ENCOUNTER — Encounter: Payer: Self-pay | Admitting: Internal Medicine

## 2021-12-23 DIAGNOSIS — R072 Precordial pain: Secondary | ICD-10-CM | POA: Diagnosis not present

## 2021-12-23 DIAGNOSIS — R002 Palpitations: Secondary | ICD-10-CM

## 2021-12-23 MED ORDER — METOPROLOL SUCCINATE ER 50 MG PO TB24: 50.0000 mg | ORAL_TABLET | Freq: Every day | ORAL | 3 refills | Status: DC

## 2021-12-23 NOTE — Progress Notes (Signed)
Cardiology Office Note:    Date:  12/23/2021   ID:  Darren Sweeney, DOB 03/28/1971, MRN [id number]  PCP:  [facility], [place] Providers Cardiologist:  Janina Mayo, MD     Referring MD: [facility]*   No chief complaint on file. Chest pain  History of Present Illness:    Darren Sweeney is a 51 y.o. male with a hx of obesity, hypertension, smoker,  DMII A1c 8.7, no CVD hx, cardiology referral from rheumatologist 2/2 progressive chest pain  Darren Sweeney states that for a year he's had sternal chest pain with activity. This has progressed and he in the AM he notes a sharp pain for 5 minutes that improves with calming down. He has progressive dyspnea on exertion. He is morbidly obese. He is an active smoker for the last 30 years. He has strong family hx of early stroke, noted in siblings deceased in their 88s. His blood pressure has gotten worse, now typically in the 170s SBP. His losartan was just increased from 25 to 100 mg. His blood glucose has been higher lately as well. His wife is concerned he has coronary disease.  CVD Risk/Equivalent: HLD- on atorvastatin 10 mg HTN- yes, poorly controlled PAD- None DMII - Yes, not well controlled A1c 8.7 Smoker- yes Family Hx- yes, stroke  Interim Hx: Darren Sweeney was recommended for LHC with anginal symptoms. He was found to have patent coronaries. LVEDP was 24 mmHg c/w diastolic CHF. He states that he gets short of breath with anything strenuous. A short walk. He is pending a new CPAP machine. He understands that his weight is contributing. 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 The nicotine patch does not help. He has normal renal functoin  Interim Hx 8/10 He saw [facility], he is CPAP. No hospitalizations. No significant DOE. No PND or LE edema. He is loosing weight. Was 377 and now 359. He is still smoking.    Cardiology Studies LHC  03/02/2021 CONCLUSIONS: LVEDP 24 mmHg.  Low normal systolic function.  Findings are compatible with chronic diastolic heart failure. Right dominant coronary anatomy.  Widely patent coronaries with no evidence of obstructive disease.  Minimal luminal irregularities are noted in the right coronary and LAD.   RECOMMENDATIONS: Management of chronic diastolic heart failure to include SGLT2 therapy, management of sleep apnea, weight loss, aerobic exercise, smoking cessation, and control of diabetes mellitus.  TTE 03/02/2021 Left ventricular ejection fraction, by estimation, is 60 to 65%. Left ventricular ejection fraction by 3D volume is 61 %. The left ventricle has normal function. The left ventricle has no regional wall motion abnormalities. Left ventricular diastolic parameters were normal. 1.Right ventricular systolic function is normal. The right ventricular size is mildly enlarged. Tricuspid regurgitation signal is inadequate for assessing PA pressure. 2. 3. The mitral valve is grossly normal. Trivial mitral valve regurgitation. 4. The aortic valve is tricuspid. Aortic valve regurgitation is not visualized. Aortic dilatation noted. There is mild dilatation of the ascending aorta, measuring 40 mm. 5. The inferior vena cava is normal in size with greater than 50% respiratory variability, suggesting right atrial pressure of 3 mmHg.  Past Medical History:  Diagnosis Date   Deafness, left    Diabetes mellitus without complication (HCC)    GERD (gastroesophageal reflux disease)    Hypertension    RA (rheumatoid arthritis) (HCC)    Shortness of breath dyspnea  on exertion   Sleep apnea    Wears glasses     Past Surgical History:  Procedure Laterality Date   BIOPSY  02/06/2020   Procedure: BIOPSY;  Surgeon: Otis Brace, MD;  [facility];  Service: Gastroenterology;;  EGD and COLON   COLONOSCOPY WITH PROPOFOL N/A 02/06/2020   Procedure: COLONOSCOPY WITH PROPOFOL;   Surgeon: Otis Brace, MD;  [facility];  Service: Gastroenterology;  Laterality: N/A;   ESOPHAGOGASTRODUODENOSCOPY (EGD) WITH PROPOFOL N/A 02/06/2020   Procedure: ESOPHAGOGASTRODUODENOSCOPY (EGD) WITH PROPOFOL;  Surgeon: Otis Brace, MD;  [facility];  Service: Gastroenterology;  Laterality: N/A;   EVALUATION UNDER ANESTHESIA WITH HEMORRHOIDECTOMY N/A 05/11/2020   Procedure: HEMORRHOIDECTOMY WITH LIGATION AND HEMORRHOIDOPEXY ANORECTAL EXAMINATION UNDER ANESTHESIA;  Surgeon: Michael Boston, MD;  Location[facility];  Service: General;  Laterality: N/A;   FINGER AMPUTATION Left    ring finger amputation   LEFT HEART CATH AND CORONARY ANGIOGRAPHY N/A 03/02/2021   Procedure: LEFT HEART CATH AND CORONARY ANGIOGRAPHY;  Surgeon: Belva Crome, MD;  Location: [facility];  Service: Cardiovascular;  Laterality: N/A;   POLYPECTOMY  02/06/2020   Procedure: POLYPECTOMY;  Surgeon: Otis Brace, MD;  [facility];  Service: Gastroenterology;;  EGD and COLON   SHOULDER ARTHROSCOPY WITH DISTAL CLAVICLE RESECTION Right 05/07/2015   Procedure: SHOULDER DIAGNOSTIC OPERATIVE ARTHROSCOPY WITH DEBRIDEMENT AND DISTAL CLAVICLE EXCISION;  Surgeon: Meredith Pel, MD;  Location: [facility];  Service: Orthopedics;  Laterality: Right;   TUMOR REMOVAL  2014   on voice box   WISDOM TOOTH EXTRACTION      Current Medications: No outpatient medications have been marked as taking for the 12/23/21 encounter (Appointment) with Janina Mayo, MD.     Allergies:   Patient has no known allergies.   Social History   Socioeconomic History   Marital status: Married    Spouse name: Not on file   Number of children: Not on file   Years of education: Not on file   Highest education level: Not on file  Occupational History   Not on file  Tobacco Use   Smoking status: Every Day    Packs/day: 1.50    Years: 28.00    Total pack years: 42.00    Types: Cigarettes   Smokeless  tobacco: Never  Vaping Use   Vaping Use: Never used  Substance and Sexual Activity   Alcohol use: No   Drug use: No   Sexual activity: Not on file  Other Topics Concern   Not on file  Social History Narrative   Not on file   Social Determinants of Health   Financial Resource Strain: Not on file  Food Insecurity: Not on file  Transportation Needs: Not on file  Physical Activity: Not on file  Stress: Not on file  Social Connections: Not on file     Family History: The patient's Sibling deceased in there 58s from stroke. Father - stroke   ROS:   Please see the history of present illness.     All other systems reviewed and are negative.  EKGs/Labs/Other Studies Reviewed:    The following studies were reviewed today:  EKG:  EKG is  ordered today.  The ekg ordered today demonstrates   02/26/2021- Sinus tachcardia HR 101 bpm, RSR', right axis deviation, RVH  no S1Q3T3 02/02/2021- NSR 05/07/2020 - NSR, LAD, IRBBB  Recent Labs: 02/26/2021: Hemoglobin 16.2; Platelets 276 03/02/2021: BUN 15; Creatinine, Ser 0.88; Potassium 4.5; Sodium 133 05/24/2021: BNP  4.1   Recent Lipid Panel No results found for: "CHOL", "TRIG", "HDL", "CHOLHDL", "VLDL", "LDLCALC", "LDLDIRECT"   Risk Assessment/Calculations:     Physical Exam:    VS:    Vitals:   12/23/21 1556  BP: 138/82  Pulse: 82  SpO2: 95%     Wt Readings from Last 3 Encounters:  05/24/21 (!) 377 lb 3.2 oz (171.1 kg)  03/02/21 (!) 383 lb (173.7 kg)  02/26/21 (!) 383 lb (173.7 kg)     GEN:  Well nourished, well developed in no acute distress. Morbidly obese HEENT: moist mucous membranes LYMPHATICS: No lymphadenopathy CARDIAC: RRR, no murmurs, rubs, gallops RESPIRATORY:  Clear to auscultation without rales, wheezing or rhonchi  ABDOMEN: Soft, non-tender, non-distended MUSCULOSKELETAL:  No edema; No deformity  SKIN: Warm and dry NEUROLOGIC:  Alert and oriented x 3 PSYCHIATRIC:  Normal affect   ASSESSMENT:     #Diastolic CHF: p/w chest pain with activity. Had [place] that showed patent CORS. LVEDP 24 mmHg c/f diastolic CHF. He's had no hospital admissions. He has profound obesity with metabolic syndrome. - euvolemic - continue lasix 20 mg daily  #Hypertension: Poorly controlled , continue metoprolol succinate '50mg'$  XL. His potassium came back at 5.5, changed losartan 100 mg to norvasc 10 mg daily - good control, aim for < 130/80 mmHg  #HLD:  LDL 77 08/01/2019 near goal. TC 142. - cont. atorvastatin 10 mg - cont. zetia  #CVD Risk: morbid obesity 54.  DMII and active smoker and family hx of heart disease.We discussed smoking cessation today, when ready can consider wellbutrin to help. Also encouraged continued weight loss. A1c goal <7 - recommend A1c surveillance - on  glipizide 5 mg daily - cont empagliflozin 10 mg   PLAN:    In order of problems listed above:   Follow 6 months    Medication Adjustments/Labs and Tests Ordered: Current medicines are reviewed at length with the patient today.  Concerns regarding medicines are outlined above.   Signed, Janina Mayo, MD  12/23/2021 3:31 PM    [facility]

## 2021-12-23 NOTE — Patient Instructions (Signed)
Medication Instructions:  Your physician recommends that you continue on your current medications as directed. Please refer to the Current Medication list given to you today.  *If you need a refill on your cardiac medications before your next appointment, please call your pharmacy*   Follow-Up: At [facility], you and your health needs are our priority.  As part of our continuing mission to provide you with exceptional heart care, we have created designated Provider Care Teams.  These Care Teams include your primary Cardiologist (physician) and Advanced Practice Providers (APPs -  Physician Assistants and Nurse Practitioners) who all work together to provide you with the care you need, when you need it.  We recommend signing up for the patient portal called "MyChart".  Sign up information is provided on this After Visit Summary.  MyChart is used to connect with patients for Virtual Visits (Telemedicine).  Patients are able to view lab/test results, encounter notes, upcoming appointments, etc.  Non-urgent messages can be sent to your provider as well.   To learn more about what you can do with MyChart, go to [email or web address].    Your next appointment:   6 month(s)  The format for your next appointment:   In Person  Provider:   Janina Mayo, MD

## 2021-12-30 DIAGNOSIS — G4733 Obstructive sleep apnea (adult) (pediatric): Secondary | ICD-10-CM | POA: Diagnosis not present

## 2021-12-30 DIAGNOSIS — I10 Essential (primary) hypertension: Secondary | ICD-10-CM | POA: Diagnosis not present

## 2022-01-30 DIAGNOSIS — G4733 Obstructive sleep apnea (adult) (pediatric): Secondary | ICD-10-CM | POA: Diagnosis not present

## 2022-01-30 DIAGNOSIS — I10 Essential (primary) hypertension: Secondary | ICD-10-CM | POA: Diagnosis not present

## 2022-02-08 DIAGNOSIS — Z79899 Other long term (current) drug therapy: Secondary | ICD-10-CM | POA: Diagnosis not present

## 2022-02-08 DIAGNOSIS — M0579 Rheumatoid arthritis with rheumatoid factor of multiple sites without organ or systems involvement: Secondary | ICD-10-CM | POA: Diagnosis not present

## 2022-02-10 ENCOUNTER — Other Ambulatory Visit: Payer: Self-pay | Admitting: Internal Medicine

## 2022-03-07 DIAGNOSIS — M25561 Pain in right knee: Secondary | ICD-10-CM | POA: Diagnosis not present

## 2022-03-07 DIAGNOSIS — M0579 Rheumatoid arthritis with rheumatoid factor of multiple sites without organ or systems involvement: Secondary | ICD-10-CM | POA: Diagnosis not present

## 2022-03-07 DIAGNOSIS — M25562 Pain in left knee: Secondary | ICD-10-CM | POA: Diagnosis not present

## 2022-03-07 DIAGNOSIS — Z79899 Other long term (current) drug therapy: Secondary | ICD-10-CM | POA: Diagnosis not present

## 2022-03-09 DIAGNOSIS — I10 Essential (primary) hypertension: Secondary | ICD-10-CM | POA: Diagnosis not present

## 2022-03-09 DIAGNOSIS — E1159 Type 2 diabetes mellitus with other circulatory complications: Secondary | ICD-10-CM | POA: Diagnosis not present

## 2022-03-09 DIAGNOSIS — E782 Mixed hyperlipidemia: Secondary | ICD-10-CM | POA: Diagnosis not present

## 2022-03-09 DIAGNOSIS — G4733 Obstructive sleep apnea (adult) (pediatric): Secondary | ICD-10-CM | POA: Diagnosis not present

## 2022-04-05 DIAGNOSIS — M0579 Rheumatoid arthritis with rheumatoid factor of multiple sites without organ or systems involvement: Secondary | ICD-10-CM | POA: Diagnosis not present

## 2022-04-05 DIAGNOSIS — Z79899 Other long term (current) drug therapy: Secondary | ICD-10-CM | POA: Diagnosis not present

## 2022-05-22 ENCOUNTER — Other Ambulatory Visit: Payer: Self-pay | Admitting: Internal Medicine

## 2022-05-31 DIAGNOSIS — M0579 Rheumatoid arthritis with rheumatoid factor of multiple sites without organ or systems involvement: Secondary | ICD-10-CM | POA: Diagnosis not present

## 2022-05-31 DIAGNOSIS — Z79899 Other long term (current) drug therapy: Secondary | ICD-10-CM | POA: Diagnosis not present

## 2022-06-08 DIAGNOSIS — E1159 Type 2 diabetes mellitus with other circulatory complications: Secondary | ICD-10-CM | POA: Diagnosis not present

## 2022-06-08 DIAGNOSIS — E782 Mixed hyperlipidemia: Secondary | ICD-10-CM | POA: Diagnosis not present

## 2022-06-08 DIAGNOSIS — I10 Essential (primary) hypertension: Secondary | ICD-10-CM | POA: Diagnosis not present

## 2022-06-20 ENCOUNTER — Ambulatory Visit: Payer: BC Managed Care – PPO | Attending: Internal Medicine | Admitting: Internal Medicine

## 2022-06-20 ENCOUNTER — Encounter: Payer: Self-pay | Admitting: Internal Medicine

## 2022-06-20 VITALS — BP 136/76 | HR 99 | Ht 70.0 in | Wt 378.2 lb

## 2022-06-20 DIAGNOSIS — I10 Essential (primary) hypertension: Secondary | ICD-10-CM | POA: Diagnosis not present

## 2022-06-20 NOTE — Patient Instructions (Signed)
Medication Instructions:  No Changes In Medications at this time.  *If you need a refill on your cardiac medications before your next appointment, please call your pharmacy*  Lab Work: None Ordered At This Time.  If you have labs (blood work) drawn today and your tests are completely normal, you will receive your results only by: [place] (if you have MyChart) OR A paper copy in the mail If you have any lab test that is abnormal or we need to change your treatment, we will call you to review the results.  Testing/Procedures: None Ordered At This Time.   Follow-Up: At [facility], you and your health needs are our priority.  As part of our continuing mission to provide you with exceptional heart care, we have created designated Provider Care Teams.  These Care Teams include your primary Cardiologist (physician) and Advanced Practice Providers (APPs -  Physician Assistants and Nurse Practitioners) who all work together to provide you with the care you need, when you need it.  Your next appointment:   1 year(s)  Provider:   Janina Mayo, MD

## 2022-06-20 NOTE — Progress Notes (Signed)
Cardiology Office Note:    Date:  06/20/2022   ID:  Darren Sweeney, DOB 03/07/1971, MRN [id number]  PCP:  [facility] [place] [facility] Cardiologist:  Janina Mayo, MD     Referring MD: [facility]   No chief complaint on file. Chest pain  History of Present Illness:    Darren Sweeney is a 53 y.o. male with a hx of obesity, hypertension, smoker,  DMII A1c 8.7, no CVD hx, cardiology referral from rheumatologist 2/2 progressive chest pain  Darren Sweeney states that for a year he's had sternal chest pain with activity. This has progressed and he in the AM he notes a sharp pain for 5 minutes that improves with calming down. He has progressive dyspnea on exertion. He is morbidly obese. He is an active smoker for the last 30 years. He has strong family hx of early stroke, noted in siblings deceased in their 22s. His blood pressure has gotten worse, now typically in the 170s SBP. His losartan was just increased from 25 to 100 mg. His blood glucose has been higher lately as well. His wife is concerned he has coronary disease.  CVD Risk/Equivalent: HLD- on atorvastatin 10 mg HTN- yes, poorly controlled PAD- None DMII - Yes, not well controlled A1c 8.7 Smoker- yes Family Hx- yes, stroke  Interim Hx: Darren Sweeney was recommended for LHC with anginal symptoms. He was found to have patent coronaries. LVEDP was 24 mmHg c/w diastolic CHF. He states that he gets short of breath with anything strenuous. A short walk. He is pending a new CPAP machine. He understands that his weight is contributing. 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 The nicotine patch does not help. He has normal renal functoin  Interim Hx 8/10 He saw [facility], he is CPAP. No hospitalizations. No significant DOE. No PND or LE edema. He is loosing weight. Was 377 and now 359. He is still smoking.   Interim Hx 06/20/2022 He returns today  for follow-up.   Cardiology Studies LHC 03/02/2021 CONCLUSIONS: LVEDP 24 mmHg.  Low normal systolic function.  Findings are compatible with chronic diastolic heart failure. Right dominant coronary anatomy.  Widely patent coronaries with no evidence of obstructive disease.  Minimal luminal irregularities are noted in the right coronary and LAD.   RECOMMENDATIONS: Management of chronic diastolic heart failure to include SGLT2 therapy, management of sleep apnea, weight loss, aerobic exercise, smoking cessation, and control of diabetes mellitus.  TTE 03/02/2021 Left ventricular ejection fraction, by estimation, is 60 to 65%. Left ventricular ejection fraction by 3D volume is 61 %. The left ventricle has normal function. The left ventricle has no regional wall motion abnormalities. Left ventricular diastolic parameters were normal. 1.Right ventricular systolic function is normal. The right ventricular size is mildly enlarged. Tricuspid regurgitation signal is inadequate for assessing PA pressure. 2. 3. The mitral valve is grossly normal. Trivial mitral valve regurgitation. 4. The aortic valve is tricuspid. Aortic valve regurgitation is not visualized. Aortic dilatation noted. There is mild dilatation of the ascending aorta, measuring 40 mm. 5. The inferior vena cava is normal in size with greater than 50% respiratory variability, suggesting right atrial pressure of 3 mmHg.  Past Medical History:  Diagnosis Date   Deafness, left    Diabetes mellitus without complication (HCC)    GERD (gastroesophageal reflux disease)    Hypertension    RA (rheumatoid arthritis) (  [place])    Shortness of breath dyspnea    on exertion   Sleep apnea    Wears glasses     Past Surgical History:  Procedure Laterality Date   BIOPSY  02/06/2020   Procedure: BIOPSY;  Surgeon: Otis Brace, MD;  Location: [facility];  Service: Gastroenterology;;  EGD and COLON   COLONOSCOPY WITH PROPOFOL N/A 02/06/2020    Procedure: COLONOSCOPY WITH PROPOFOL;  Surgeon: Otis Brace, MD;  [facility];  Service: Gastroenterology;  Laterality: N/A;   ESOPHAGOGASTRODUODENOSCOPY (EGD) WITH PROPOFOL N/A 02/06/2020   Procedure: ESOPHAGOGASTRODUODENOSCOPY (EGD) WITH PROPOFOL;  Surgeon: Otis Brace, MD;  [facility];  Service: Gastroenterology;  Laterality: N/A;   EVALUATION UNDER ANESTHESIA WITH HEMORRHOIDECTOMY N/A 05/11/2020   Procedure: HEMORRHOIDECTOMY WITH LIGATION AND HEMORRHOIDOPEXY ANORECTAL EXAMINATION UNDER ANESTHESIA;  Surgeon: Michael Boston, MD;  Location: [facility];  Service: General;  Laterality: N/A;   FINGER AMPUTATION Left    ring finger amputation   LEFT HEART CATH AND CORONARY ANGIOGRAPHY N/A 03/02/2021   Procedure: LEFT HEART CATH AND CORONARY ANGIOGRAPHY;  Surgeon: Belva Crome, MD;  Location: [facility];  Service: Cardiovascular;  Laterality: N/A;   POLYPECTOMY  02/06/2020   Procedure: POLYPECTOMY;  Surgeon: Otis Brace, MD;  [facility];  Service: Gastroenterology;;  EGD and COLON   SHOULDER ARTHROSCOPY WITH DISTAL CLAVICLE RESECTION Right 05/07/2015   Procedure: SHOULDER DIAGNOSTIC OPERATIVE ARTHROSCOPY WITH DEBRIDEMENT AND DISTAL CLAVICLE EXCISION;  Surgeon: Meredith Pel, MD;  Location: [facility];  Service: Orthopedics;  Laterality: Right;   TUMOR REMOVAL  2014   on voice box   WISDOM TOOTH EXTRACTION      Current Medications: No outpatient medications have been marked as taking for the 06/20/22 encounter (Appointment) with Janina Mayo, MD.     Allergies:   Patient has no known allergies.   Social History   Socioeconomic History   Marital status: Married    Spouse name: Not on file   Number of children: Not on file   Years of education: Not on file   Highest education level: Not on file  Occupational History   Not on file  Tobacco Use   Smoking status: Every Day    Packs/day: 1.50    Years: 28.00    Total pack years:  42.00    Types: Cigarettes   Smokeless tobacco: Never  Vaping Use   Vaping Use: Never used  Substance and Sexual Activity   Alcohol use: No   Drug use: No   Sexual activity: Not on file  Other Topics Concern   Not on file  Social History Narrative   Not on file   Social Determinants of Health   Financial Resource Strain: Not on file  Food Insecurity: Not on file  Transportation Needs: Not on file  Physical Activity: Not on file  Stress: Not on file  Social Connections: Not on file     Family History: The patient's Sibling deceased in there 2s from stroke. Father - stroke   ROS:   Please see the history of present illness.     All other systems reviewed and are negative.  EKGs/Labs/Other Studies Reviewed:    The following studies were reviewed today:  EKG:  EKG is  ordered today.  The ekg ordered today demonstrates   02/26/2021- Sinus tachcardia HR 101 bpm, RSR', right axis deviation, RVH  no S1Q3T3 02/02/2021- NSR 05/07/2020 - NSR, LAD, IRBBB  Recent Labs: No results found for requested labs  within last 365 days.   Recent Lipid Panel No results found for: "CHOL", "TRIG", "HDL", "CHOLHDL", "VLDL", "LDLCALC", "LDLDIRECT"   Risk Assessment/Calculations:     Physical Exam:    VS:    Vitals:   06/20/22 0807  BP: 136/76  Pulse: 99  SpO2: 94%    Wt Readings from Last 3 Encounters:  12/23/21 (!) 359 lb (162.8 kg)  05/24/21 (!) 377 lb 3.2 oz (171.1 kg)  03/02/21 (!) 383 lb (173.7 kg)     GEN:  Well nourished, well developed in no acute distress. Morbidly obese HEENT: moist mucous membranes LYMPHATICS: No lymphadenopathy CARDIAC: RRR, no murmurs, rubs, gallops RESPIRATORY:  Clear to auscultation without rales, wheezing or rhonchi  ABDOMEN: Soft, non-tender, non-distended MUSCULOSKELETAL:  No edema; No deformity  SKIN: Warm and dry NEUROLOGIC:  Alert and oriented x 3 PSYCHIATRIC:  Normal affect   ASSESSMENT:    #Diastolic CHF: p/w chest pain with  activity. Had [place] that showed patent CORS. LVEDP 24 mmHg c/f diastolic CHF. He's had no hospital admissions. He has profound obesity with metabolic syndrome. - euvolemic - continue lasix 20 mg daily  #Hypertension:  continue metoprolol succinate '50mg'$  XL. His potassium came back at 5.5, changed losartan 100 mg to norvasc 10 mg daily; stopped taking norvasc - continue metoprolol XL 50 mg daily - good control, aim for < 130/80 mmHg  #HLD:  LDL 77 08/01/2019 near goal. TC 142. - cont. atorvastatin 10 mg - cont. zetia  #CVD Risk: morbid obesity 54.  DMII and active smoker and family hx of heart disease.We discussed smoking cessation today, when ready can consider wellbutrin to help. Also encouraged continued weight loss. A1c goal <7 - recommend A1c surveillance - on  glipizide 5 mg daily - cont empagliflozin 10 mg   Smoking cessation: wellbutrin  Obesity: mounjaro  PLAN:    In order of problems listed above:   Follow 12 months    Medication Adjustments/Labs and Tests Ordered: Current medicines are reviewed at length with the patient today.  Concerns regarding medicines are outlined above.   Signed, Janina Mayo, MD  06/20/2022 8:07 AM    [facility]

## 2022-06-21 NOTE — Addendum Note (Signed)
Addended by: Rexanne Mano B on: 06/21/2022 11:05 AM   Modules accepted: Orders

## 2022-07-26 DIAGNOSIS — Z79899 Other long term (current) drug therapy: Secondary | ICD-10-CM | POA: Diagnosis not present

## 2022-07-26 DIAGNOSIS — M0579 Rheumatoid arthritis with rheumatoid factor of multiple sites without organ or systems involvement: Secondary | ICD-10-CM | POA: Diagnosis not present

## 2022-07-26 DIAGNOSIS — R5383 Other fatigue: Secondary | ICD-10-CM | POA: Diagnosis not present

## 2022-07-26 DIAGNOSIS — Z111 Encounter for screening for respiratory tuberculosis: Secondary | ICD-10-CM | POA: Diagnosis not present

## 2022-08-23 ENCOUNTER — Telehealth: Payer: Self-pay | Admitting: Internal Medicine

## 2022-08-23 DIAGNOSIS — R002 Palpitations: Secondary | ICD-10-CM

## 2022-08-23 DIAGNOSIS — R072 Precordial pain: Secondary | ICD-10-CM

## 2022-08-23 DIAGNOSIS — I5032 Chronic diastolic (congestive) heart failure: Secondary | ICD-10-CM

## 2022-08-23 NOTE — Telephone Encounter (Signed)
*  STAT* If patient is at the pharmacy, call can be transferred to refill team.   1. Which medications need to be refilled? (please list name of each medication and dose if known)   metoprolol succinate (TOPROL-XL) 50 MG 24 hr tablet  JARDIANCE 10 MG TABS tablet  furosemide (LASIX) 20 MG tablet   2. Which pharmacy/location (including street and city if local pharmacy) is medication to be sent to?  [facility] - [place], [place] - [address]   3. Do they need a 30 day or 90 day supply?  90 day  Wife states patient still has some of this medication.

## 2022-08-26 MED ORDER — METOPROLOL SUCCINATE ER 50 MG PO TB24: 50.0000 mg | ORAL_TABLET | Freq: Every day | ORAL | 3 refills | Status: DC

## 2022-08-26 MED ORDER — FUROSEMIDE 20 MG PO TABS: 20.0000 mg | ORAL_TABLET | Freq: Every day | ORAL | 3 refills | Status: DC

## 2022-08-26 MED ORDER — EMPAGLIFLOZIN 10 MG PO TABS
10.0000 mg | ORAL_TABLET | Freq: Every day | ORAL | 3 refills | Status: DC
Start: 2022-08-26 — End: 2022-11-16

## 2022-08-26 NOTE — Telephone Encounter (Signed)
Refills for metoprolol succinate 50mg , Jardiance 10mg , and furosemide 20mg  sent electronically to [facility] as requested. LMOVM for Darren Sweeney (ok per DPR) with update and office number for any questions.

## 2022-09-08 DIAGNOSIS — Z79899 Other long term (current) drug therapy: Secondary | ICD-10-CM | POA: Diagnosis not present

## 2022-09-08 DIAGNOSIS — M25561 Pain in right knee: Secondary | ICD-10-CM | POA: Diagnosis not present

## 2022-09-08 DIAGNOSIS — M0579 Rheumatoid arthritis with rheumatoid factor of multiple sites without organ or systems involvement: Secondary | ICD-10-CM | POA: Diagnosis not present

## 2022-09-08 DIAGNOSIS — M25562 Pain in left knee: Secondary | ICD-10-CM | POA: Diagnosis not present

## 2022-09-20 DIAGNOSIS — Z79899 Other long term (current) drug therapy: Secondary | ICD-10-CM | POA: Diagnosis not present

## 2022-09-20 DIAGNOSIS — M0579 Rheumatoid arthritis with rheumatoid factor of multiple sites without organ or systems involvement: Secondary | ICD-10-CM | POA: Diagnosis not present

## 2022-11-15 DIAGNOSIS — Z79899 Other long term (current) drug therapy: Secondary | ICD-10-CM | POA: Diagnosis not present

## 2022-11-15 DIAGNOSIS — R5383 Other fatigue: Secondary | ICD-10-CM | POA: Diagnosis not present

## 2022-11-15 DIAGNOSIS — M0579 Rheumatoid arthritis with rheumatoid factor of multiple sites without organ or systems involvement: Secondary | ICD-10-CM | POA: Diagnosis not present

## 2022-11-16 ENCOUNTER — Other Ambulatory Visit: Payer: Self-pay

## 2022-11-16 MED ORDER — METOPROLOL SUCCINATE ER 50 MG PO TB24: 50.0000 mg | ORAL_TABLET | Freq: Every day | ORAL | 2 refills | Status: DC

## 2022-11-16 MED ORDER — FUROSEMIDE 20 MG PO TABS: 20.0000 mg | ORAL_TABLET | Freq: Every day | ORAL | 2 refills | Status: DC

## 2022-11-16 MED ORDER — EMPAGLIFLOZIN 10 MG PO TABS: 10.0000 mg | ORAL_TABLET | Freq: Every day | ORAL | 2 refills | Status: DC

## 2022-12-21 DIAGNOSIS — F17210 Nicotine dependence, cigarettes, uncomplicated: Secondary | ICD-10-CM | POA: Diagnosis not present

## 2022-12-21 DIAGNOSIS — E7800 Pure hypercholesterolemia, unspecified: Secondary | ICD-10-CM | POA: Diagnosis not present

## 2022-12-21 DIAGNOSIS — E1159 Type 2 diabetes mellitus with other circulatory complications: Secondary | ICD-10-CM | POA: Diagnosis not present

## 2022-12-21 DIAGNOSIS — I10 Essential (primary) hypertension: Secondary | ICD-10-CM | POA: Diagnosis not present

## 2022-12-29 ENCOUNTER — Telehealth: Payer: Self-pay

## 2022-12-29 NOTE — Telephone Encounter (Signed)
Spoke to patient's wife.Advised patient is Dr.Branch's patient and he was scheduled with Dr.Jordan 8/19.Stated he has been having chest pain off and on for the past 2 months.Appointment cancelled with Dr.Jordan.Appointment scheduled with Joni Reining DNP 8/22 at 8:50 am.Advised if he has anymore chest pain he needs to go to ED to be evaluated.

## 2023-01-02 ENCOUNTER — Ambulatory Visit: Payer: BC Managed Care – PPO | Admitting: Cardiology

## 2023-01-02 NOTE — Progress Notes (Unsigned)
Cardiology Clinic Note   Patient Name: Darren Sweeney Date of Encounter: 01/05/2023  Primary Care Provider:  [facility] Primary Cardiologist:  Darren Fus, MD  Patient Profile    52 year old male with history of obesity, type 2 diabetes, hypertension, tobacco abuse.  Was seen by Darren Sweeney on 06/20/2022 for complaints of chest pain and dyspnea on exertion.  It was noted that the patient had a cardiac catheterization on 03/02/2021 with patent coronaries and no evidence of obstructive disease, minimal luminal irregularities are noted in the right coronary and LAD.  He was found to have diastolic CHF.  Past Medical History    Past Medical History:  Diagnosis Date   Deafness, left    Diabetes mellitus without complication (HCC)    GERD (gastroesophageal reflux disease)    Hypertension    RA (rheumatoid arthritis) (HCC)    Shortness of breath dyspnea    on exertion   Sleep apnea    Wears glasses    Past Surgical History:  Procedure Laterality Date   BIOPSY  02/06/2020   Procedure: BIOPSY;  Surgeon: Darren Der, MD;  [facility];  Service: Gastroenterology;;  EGD and COLON   COLONOSCOPY WITH PROPOFOL N/A 02/06/2020   Procedure: COLONOSCOPY WITH PROPOFOL;  Surgeon: Darren Der, MD;  [facility];  Service: Gastroenterology;  Laterality: N/A;   ESOPHAGOGASTRODUODENOSCOPY (EGD) WITH PROPOFOL N/A 02/06/2020   Procedure: ESOPHAGOGASTRODUODENOSCOPY (EGD) WITH PROPOFOL;  Surgeon: Darren Der, MD;  [facility];  Service: Gastroenterology;  Laterality: N/A;   EVALUATION UNDER ANESTHESIA WITH HEMORRHOIDECTOMY N/A 05/11/2020   Procedure: HEMORRHOIDECTOMY WITH LIGATION AND HEMORRHOIDOPEXY ANORECTAL EXAMINATION UNDER ANESTHESIA;  Surgeon: Darren Soda, MD;  Location: [facility];  Service: General;  Laterality: N/A;   FINGER AMPUTATION Left    ring finger amputation   LEFT HEART CATH AND CORONARY ANGIOGRAPHY N/A  03/02/2021   Procedure: LEFT HEART CATH AND CORONARY ANGIOGRAPHY;  Surgeon: Darren Records, MD;  Location: [facility];  Service: Cardiovascular;  Laterality: N/A;   POLYPECTOMY  02/06/2020   Procedure: POLYPECTOMY;  Surgeon: Darren Der, MD;  [facility];  Service: Gastroenterology;;  EGD and COLON   SHOULDER ARTHROSCOPY WITH DISTAL CLAVICLE RESECTION Right 05/07/2015   Procedure: SHOULDER DIAGNOSTIC OPERATIVE ARTHROSCOPY WITH DEBRIDEMENT AND DISTAL CLAVICLE EXCISION;  Surgeon: Darren Copa, MD;  Location: [facility];  Service: Orthopedics;  Laterality: Right;   TUMOR REMOVAL  2014   on voice box   WISDOM TOOTH EXTRACTION      Allergies  No Known Allergies  History of Present Illness    Darren Sweeney comes today for ongoing assessment and management of diastolic heart failure, hypertension, hyperlipidemia, with history of ongoing tobacco abuse, and morbid obesity.  He comes today with ongoing dyspnea on exertion, and wheezing.  He does have OSA and is compliant with CPAP but he states he probably takes it off at night sometimes.  Unfortunately continues to smoke up to a pack a day.  He denies any edema, PND or orthopnea.  Home Medications    Current Outpatient Medications  Medication Sig Dispense Refill   albuterol (VENTOLIN HFA) 108 (90 Base) MCG/ACT inhaler Inhale 2 puffs into the lungs every 6 (six) hours as needed for wheezing or shortness of breath. 8 g 2   APPLE CIDER VINEGAR PO Take 1 capsule by mouth every evening.     atorvastatin (LIPITOR) 10 MG tablet Take 10 mg by mouth every morning.  empagliflozin (JARDIANCE) 10 MG TABS tablet Take 1 tablet (10 mg total) by mouth daily before breakfast. 90 tablet 2   etodolac (LODINE XL) 500 MG 24 hr tablet Take 1 tablet by mouth 2 (two) times daily with a meal.     ezetimibe (ZETIA) 10 MG tablet Take 10 mg by mouth every morning.     fenofibrate micronized (LOFIBRA) 200 MG capsule Take 200 mg by mouth daily.      folic acid (FOLVITE) 1 MG tablet Take 1 mg by mouth 2 (two) times daily.     furosemide (LASIX) 20 MG tablet Take 1 tablet (20 mg total) by mouth daily. 90 tablet 2   glipiZIDE (GLUCOTROL) 5 MG tablet Take 5 mg by mouth daily before breakfast.     golimumab (SIMPONI ARIA) 50 MG/4ML SOLN injection 2mg /kg     metFORMIN (GLUCOPHAGE) 1000 MG tablet Take 1,000 mg by mouth 2 (two) times daily with a meal.     methotrexate 50 MG/2ML injection Inject 0.8 mLs (20 mg total) into the skin once a week. (Patient taking differently: Inject 20 mg into the skin every Sunday. Inject 0.42ml (20mg )) 10 mL 0   metoprolol succinate (TOPROL-XL) 50 MG 24 hr tablet Take 1 tablet (50 mg total) by mouth daily. Take with or immediately following a meal. 90 tablet 2   Misc Natural Products (TART CHERRY ADVANCED PO) Take 1 capsule by mouth every morning.      Omega 3 1200 MG CAPS Take 1,200 mg by mouth at bedtime.     omeprazole (PRILOSEC) 40 MG capsule Take 40 mg by mouth daily with breakfast.  3   tirzepatide (MOUNJARO) 2.5 MG/0.5ML Pen Inject 2.5 mg into the skin once a week.     No current facility-administered medications for this visit.     Family History    History reviewed. No pertinent family history. has no family status information on file.   Social History    Social History   Socioeconomic History   Marital status: Married    Spouse name: Not on file   Number of children: Not on file   Years of education: Not on file   Highest education level: Not on file  Occupational History   Not on file  Tobacco Use   Smoking status: Every Day    Current packs/day: 1.50    Average packs/day: 1.5 packs/day for 28.0 years (42.0 ttl pk-yrs)    Types: Cigarettes   Smokeless tobacco: Never  Vaping Use   Vaping status: Never Used  Substance and Sexual Activity   Alcohol use: No   Drug use: No   Sexual activity: Not on file  Other Topics Concern   Not on file  Social History Narrative   Not on file    Social Determinants of Health   Financial Resource Strain: Not on file  Food Insecurity: Not on file  Transportation Needs: Not on file  Physical Activity: Not on file  Stress: Not on file  Social Connections: Not on file  Intimate Partner Violence: Not on file     Review of Systems    General:  No chills, fever, night sweats or weight changes.  Cardiovascular:  No chest pain, dyspnea on exertion, edema, orthopnea, palpitations, paroxysmal nocturnal dyspnea. Dermatological: No rash, lesions/masses Respiratory: No cough, dyspnea Urologic: No hematuria, dysuria Abdominal:   No nausea, vomiting, diarrhea, bright red blood per rectum, melena, or hematemesis Neurologic:  No visual changes, wkns, changes in mental status. All other  systems reviewed and are otherwise negative except as noted above.  EKG Interpretation Date/Time:  Thursday January 05 2023 09:00:36 EDT Ventricular Rate:  86 PR Interval:  178 QRS Duration:  112 QT Interval:  374 QTC Calculation: 447 R Axis:   243  Text Interpretation: Normal sinus rhythm Right bundle branch block When compared with ECG of 07-May-2020 08:32, No significant change was found Confirmed by Joni Reining [phone]) on 01/05/2023 9:27:22 AM    Physical Exam    VS:  BP 118/84   Pulse 89   Ht 5\' 10"  (1.778 m)   Wt (!) 381 lb (172.8 kg)   SpO2 95%   BMI 54.67 kg/m  , BMI Body mass index is 54.67 kg/m.     GEN: Well nourished, well developed, in no acute distress. Morbidly obese HEENT: normal. Neck: Supple, no JVD, carotid bruits, or masses. Cardiac: RRR, no murmurs, rubs, or gallops. No clubbing, cyanosis, edema.  Radials/DP/PT 2+ and equal bilaterally.  Respiratory:  Respirations regular and unlabored,  Bilateral inspiratory wheezes,  GI: Soft, nontender, nondistended, BS + x 4. MS: no deformity or atrophy. Skin: warm and dry, no rash. Neuro:  Strength and sensation are intact. Psych: Normal affect.  EKG  Interpretation Date/Time:  Thursday January 05 2023 09:00:36 EDT Ventricular Rate:  86 PR Interval:  178 QRS Duration:  112 QT Interval:  374 QTC Calculation: 447 R Axis:   243  Text Interpretation: Normal sinus rhythm Right bundle branch block When compared with ECG of 07-May-2020 08:32, No significant change was found Confirmed by Joni Reining [phone]) on 01/05/2023 9:27:22 AM   Lab Results  Component Value Date   WBC 11.2 (H) 02/26/2021   HGB 16.2 02/26/2021   HCT 47.7 02/26/2021   MCV 83 02/26/2021   PLT 276 02/26/2021   Lab Results  Component Value Date   CREATININE 0.88 03/02/2021   BUN 15 03/02/2021   NA 133 (L) 03/02/2021   K 4.5 03/02/2021   CL 101 03/02/2021   CO2 22 03/02/2021   Lab Results  Component Value Date   ALT 48 (H) 05/31/2016   AST 32 05/31/2016   ALKPHOS 61 05/31/2016   BILITOT 0.3 05/31/2016   No results found for: "CHOL", "HDL", "LDLCALC", "LDLDIRECT", "TRIG", "CHOLHDL"  Lab Results  Component Value Date   HGBA1C 7.2 (H) 05/24/2021     Review of Prior Studies EKG Interpretation Date/Time:  Thursday January 05 2023 09:00:36 EDT Ventricular Rate:  86 PR Interval:  178 QRS Duration:  112 QT Interval:  374 QTC Calculation: 447 R Axis:   243  Text Interpretation: Normal sinus rhythm Right bundle branch block When compared with ECG of 07-May-2020 08:32, No significant change was found Confirmed by Joni Reining [phone]) on 01/05/2023 9:27:22 AM  Echocardiogram 03/29/2021  1. Left ventricular ejection fraction, by estimation, is 60 to 65%. Left  ventricular ejection fraction by 3D volume is 61 %. The left ventricle has  normal function. The left ventricle has no regional wall motion  abnormalities. Left ventricular diastolic   parameters were normal.   2. Right ventricular systolic function is normal. The right ventricular  size is mildly enlarged. Tricuspid regurgitation signal is inadequate for  assessing PA pressure.   3. The mitral  valve is grossly normal. Trivial mitral valve  regurgitation.   4. The aortic valve is tricuspid. Aortic valve regurgitation is not  visualized.   5. Aortic dilatation noted. There is mild dilatation of the ascending  aorta, measuring 40  mm.   6. The inferior vena cava is normal in size with greater than 50%  respiratory variability, suggesting right atrial pressure of 3 mmHg.   Comparison(s): No prior Echocardiogram.   LHC 03/02/2021 CONCLUSIONS: LVEDP 24 mmHg.  Low normal systolic function.  Findings are compatible with chronic diastolic heart failure. Right dominant coronary anatomy.  Widely patent coronaries with no evidence of obstructive disease.  Minimal luminal irregularities are noted in the right coronary and LAD.     Assessment & Plan   1.  Chronic diastolic CHF: No evidence of volume overload currently.  No significant edema.  He will remain on Lasix 20 mg daily as directed low-sodium diet is recommended.  Most recent creatinine July 2024, 0.75.   2.  Hypertension: On metoprolol 50 mg daily.  BP currently well-controlled.  EKG reveals normal sinus rhythm with chronic right bundle branch block heart rate 86 bpm.  Unchanged from previous EKG  3.  Emphysema: Related to ongoing tobacco abuse.  Worsening wheezing.  Given prescription for albuterol inhaler he is to follow-up with PCP for further testing and/or referral to pulmonology, or addition of other inhalers at their discretion.  He verbalizes understanding.  4.  Hypercholesterolemia: Remains on atorvastatin and Zetia.  Labs are followed by PCP.  5. Ongoing tobacco abuse: Smoking cessation is discussed and strongly recommended.   6. Type II Diabetes: Followed by PCP.  7. OSA: On CPAP with intermittent compliance.     Signed, Bettey Mare. Liborio Nixon, ANP, AACC   01/05/2023 10:05 AM      Office [phone] Fax [phone]  Notice: This dictation was prepared with Dragon dictation along with smaller phrase  technology. Any transcriptional errors that result from this process are unintentional and may not be corrected upon review.

## 2023-01-05 ENCOUNTER — Encounter: Payer: Self-pay | Admitting: Adult Health

## 2023-01-05 ENCOUNTER — Ambulatory Visit: Payer: BC Managed Care – PPO | Attending: Cardiology | Admitting: Adult Health

## 2023-01-05 VITALS — BP 118/84 | HR 89 | Ht 70.0 in | Wt 381.0 lb

## 2023-01-05 DIAGNOSIS — R002 Palpitations: Secondary | ICD-10-CM | POA: Diagnosis not present

## 2023-01-05 DIAGNOSIS — R062 Wheezing: Secondary | ICD-10-CM

## 2023-01-05 MED ORDER — ALBUTEROL SULFATE HFA 108 (90 BASE) MCG/ACT IN AERS
2.0000 | INHALATION_SPRAY | RESPIRATORY_TRACT | Status: DC | PRN
Start: 2023-01-05 — End: 2023-01-05

## 2023-01-05 MED ORDER — ALBUTEROL SULFATE HFA 108 (90 BASE) MCG/ACT IN AERS: 2.0000 | INHALATION_SPRAY | Freq: Four times a day (QID) | RESPIRATORY_TRACT | 2 refills | Status: DC | PRN

## 2023-01-05 NOTE — Patient Instructions (Signed)
Medication Instructions:  Start  Albuterol  90 MCG ( As Directed). *If you need a refill on your cardiac medications before your next appointment, please call your pharmacy*   Lab Work: No labs If you have labs (blood work) drawn today and your tests are completely normal, you will receive your results only by: MyChart Message (if you have MyChart) OR A paper copy in the mail If you have any lab test that is abnormal or we need to change your treatment, we will call you to review the results.   Testing/Procedures: No Testing   Follow-Up: At [facility], you and your health needs are our priority.  As part of our continuing mission to provide you with exceptional heart care, we have created designated Provider Care Teams.  These Care Teams include your primary Cardiologist (physician) and Advanced Practice Providers (APPs -  Physician Assistants and Nurse Practitioners) who all work together to provide you with the care you need, when you need it.  We recommend signing up for the patient portal called "MyChart".  Sign up information is provided on this After Visit Summary.  MyChart is used to connect with patients for Virtual Visits (Telemedicine).  Patients are able to view lab/test results, encounter notes, upcoming appointments, etc.  Non-urgent messages can be sent to your provider as well.   To learn more about what you can do with MyChart, go to [email or web address].    Your next appointment:   6 month(s)  Provider:   Maisie Fus, MD

## 2023-01-10 DIAGNOSIS — Z79899 Other long term (current) drug therapy: Secondary | ICD-10-CM | POA: Diagnosis not present

## 2023-01-10 DIAGNOSIS — M0579 Rheumatoid arthritis with rheumatoid factor of multiple sites without organ or systems involvement: Secondary | ICD-10-CM | POA: Diagnosis not present

## 2023-03-07 DIAGNOSIS — M0579 Rheumatoid arthritis with rheumatoid factor of multiple sites without organ or systems involvement: Secondary | ICD-10-CM | POA: Diagnosis not present

## 2023-03-07 DIAGNOSIS — Z79899 Other long term (current) drug therapy: Secondary | ICD-10-CM | POA: Diagnosis not present

## 2023-03-09 DIAGNOSIS — Z79899 Other long term (current) drug therapy: Secondary | ICD-10-CM | POA: Diagnosis not present

## 2023-03-09 DIAGNOSIS — M25562 Pain in left knee: Secondary | ICD-10-CM | POA: Diagnosis not present

## 2023-03-09 DIAGNOSIS — M25561 Pain in right knee: Secondary | ICD-10-CM | POA: Diagnosis not present

## 2023-03-09 DIAGNOSIS — M0579 Rheumatoid arthritis with rheumatoid factor of multiple sites without organ or systems involvement: Secondary | ICD-10-CM | POA: Diagnosis not present

## 2023-04-27 ENCOUNTER — Other Ambulatory Visit: Payer: Self-pay | Admitting: Adult Health

## 2023-05-02 DIAGNOSIS — M0579 Rheumatoid arthritis with rheumatoid factor of multiple sites without organ or systems involvement: Secondary | ICD-10-CM | POA: Diagnosis not present

## 2023-05-02 DIAGNOSIS — Z79899 Other long term (current) drug therapy: Secondary | ICD-10-CM | POA: Diagnosis not present

## 2023-05-12 DIAGNOSIS — E1159 Type 2 diabetes mellitus with other circulatory complications: Secondary | ICD-10-CM | POA: Diagnosis not present

## 2023-05-12 DIAGNOSIS — J069 Acute upper respiratory infection, unspecified: Secondary | ICD-10-CM | POA: Diagnosis not present

## 2023-05-12 DIAGNOSIS — I10 Essential (primary) hypertension: Secondary | ICD-10-CM | POA: Diagnosis not present

## 2023-05-21 ENCOUNTER — Other Ambulatory Visit: Payer: Self-pay | Admitting: Internal Medicine

## 2023-05-25 ENCOUNTER — Other Ambulatory Visit: Payer: Self-pay | Admitting: Internal Medicine

## 2023-06-26 ENCOUNTER — Telehealth: Payer: Self-pay | Admitting: Internal Medicine

## 2023-06-26 ENCOUNTER — Other Ambulatory Visit: Payer: Self-pay

## 2023-06-26 MED ORDER — EMPAGLIFLOZIN 10 MG PO TABS: 10.0000 mg | ORAL_TABLET | Freq: Every day | ORAL | 2 refills | Status: DC

## 2023-06-26 MED ORDER — EMPAGLIFLOZIN 25 MG PO TABS: 25.0000 mg | ORAL_TABLET | Freq: Every day | ORAL | 11 refills | Status: AC

## 2023-06-26 NOTE — Telephone Encounter (Signed)
 Send in script for 10 mg as requested. Called wife to let her know and she states that the PCP has increased it to 25 mg so informed her I would change his dose on our records and resend. She wants all meds to go to [facility] [place] only.

## 2023-06-26 NOTE — Telephone Encounter (Signed)
 Pt c/o medication issue:  1. Name of Medication: JARDIANCE  10 MG TABS tablet   2. How are you currently taking this medication (dosage and times per day)?   3. Are you having a reaction (difficulty breathing--STAT)?   4. What is your medication issue? Patient's wife is requesting this medication be sent only to:  [id number] - [place], [place] - [address]

## 2023-06-27 DIAGNOSIS — M0579 Rheumatoid arthritis with rheumatoid factor of multiple sites without organ or systems involvement: Secondary | ICD-10-CM | POA: Diagnosis not present

## 2023-06-27 DIAGNOSIS — Z79899 Other long term (current) drug therapy: Secondary | ICD-10-CM | POA: Diagnosis not present

## 2023-08-15 NOTE — Progress Notes (Unsigned)
 Cardiology Office Note    Patient Name: Darren Sweeney Date of Encounter: 08/15/2023  Primary Care Provider:  [facility] Primary Cardiologist:  Maisie Fus, MD Primary Electrophysiologist: None   Past Medical History    Past Medical History:  Diagnosis Date   Deafness, left    Diabetes mellitus without complication (HCC)    GERD (gastroesophageal reflux disease)    Hypertension    RA (rheumatoid arthritis) (HCC)    Shortness of breath dyspnea    on exertion   Sleep apnea    Wears glasses     History of Present Illness  Darren Sweeney is a 53 y.o. male with a PMH of HTN, DM type II, tobacco abuse, obesity, OSA (on CPAP) HFpEF who presents today for 64-month follow-up.  Mr. Darren Sweeney was seen initially by Dr. Wyline Mood by referral of rheumatologist for complaint of chest pain.  During his visit he reported sternal chest pain that was sharp and improved with calming down.  He also noted progressive dyspnea on exertion with worsening BP.  He was found due to elevated risk with diabetes and tobacco abuse as well as family history of CAD to undergo LHC.  During procedure he was found to have widely patent coronary arteries with no evidence of obstruction.  LVEDP was 24 mmHg low normal compatible with chronic diastolic CHF.  He was started on Jardiance as well as Toprol XL.  He was last seen by Dr. Wyline Mood on 06/20/2022 for follow-up visit.  He was found to be euvolemic and was advised to continue Lasix 20 mg daily and blood pressures were under good control with metoprolol.  He was advised to consider Wellbutrin for smoking cessation.  Patient denies chest pain, palpitations, dyspnea, PND, orthopnea, nausea, vomiting, dizziness, syncope, edema, weight gain, or early satiety.   Discussed the use of AI scribe software for clinical note transcription with the patient, who gave verbal consent to proceed.  History of Present Illness    ***Notes: -Last ischemic  evaluation:  Review of Systems  Please see the history of present illness.    All other systems reviewed and are otherwise negative except as noted above.  Physical Exam    Wt Readings from Last 3 Encounters:  01/05/23 (!) 381 lb (172.8 kg)  06/20/22 (!) 378 lb 3.2 oz (171.6 kg)  12/23/21 (!) 359 lb (162.8 kg)   IO:NGEXB were no vitals filed for this visit.,There is no height or weight on file to calculate BMI. GEN: Well nourished, well developed in no acute distress Neck: No JVD; No carotid bruits Pulmonary: Clear to auscultation without rales, wheezing or rhonchi  Cardiovascular: Normal rate. Regular rhythm. Normal S1. Normal S2.   Murmurs: There is no murmur.  ABDOMEN: Soft, non-tender, non-distended EXTREMITIES:  No edema; No deformity   EKG/LABS/ Recent Cardiac Studies   ECG personally reviewed by me today - ***  Risk Assessment/Calculations:   {Does this patient have ATRIAL FIBRILLATION?[phone]}      Lab Results  Component Value Date   WBC 11.2 (H) 02/26/2021   HGB 16.2 02/26/2021   HCT 47.7 02/26/2021   MCV 83 02/26/2021   PLT 276 02/26/2021   Lab Results  Component Value Date   CREATININE 0.88 03/02/2021   BUN 15 03/02/2021   NA 133 (L) 03/02/2021   K 4.5 03/02/2021   CL 101 03/02/2021   CO2 22 03/02/2021   No results found for: "CHOL", "HDL", "LDLCALC", "LDLDIRECT", "TRIG", "CHOLHDL"  Lab Results  Component Value Date   HGBA1C 7.2 (H) 05/24/2021   Assessment & Plan    1.  HFpEF  2.  Essential hypertension  3.  Hypercholesteremia  4.  Tobacco abuse  5.  DM type II      Disposition: Follow-up with Maisie Fus, MD or APP in *** months {Are you ordering a CV Procedure (e.g. stress test, cath, DCCV, TEE, etc)?   Press F2        :[id number]}   Signed, Napoleon Form, Leodis Rains, NP 08/15/2023, 5:38 PM [facility]

## 2023-08-16 ENCOUNTER — Encounter: Payer: Self-pay | Admitting: Nurse Practitioner

## 2023-08-16 ENCOUNTER — Other Ambulatory Visit: Payer: Self-pay

## 2023-08-16 ENCOUNTER — Emergency Department (HOSPITAL_BASED_OUTPATIENT_CLINIC_OR_DEPARTMENT_OTHER)
Admission: EM | Admit: 2023-08-16 | Discharge: 2023-08-16 | Disposition: A | Discharge status: Home / Self Care | Attending: Emergency Medicine | Admitting: Emergency Medicine

## 2023-08-16 ENCOUNTER — Emergency Department (HOSPITAL_BASED_OUTPATIENT_CLINIC_OR_DEPARTMENT_OTHER): Admitting: Radiology

## 2023-08-16 ENCOUNTER — Ambulatory Visit: Attending: Nurse Practitioner | Admitting: Nurse Practitioner

## 2023-08-16 ENCOUNTER — Telehealth: Payer: Self-pay | Admitting: Cardiology

## 2023-08-16 ENCOUNTER — Encounter (HOSPITAL_BASED_OUTPATIENT_CLINIC_OR_DEPARTMENT_OTHER): Payer: Self-pay | Admitting: *Deleted

## 2023-08-16 VITALS — BP 128/88 | HR 97 | Ht 70.0 in | Wt 379.6 lb

## 2023-08-16 DIAGNOSIS — Z720 Tobacco use: Secondary | ICD-10-CM

## 2023-08-16 DIAGNOSIS — I10 Essential (primary) hypertension: Secondary | ICD-10-CM

## 2023-08-16 DIAGNOSIS — Z79899 Other long term (current) drug therapy: Secondary | ICD-10-CM | POA: Diagnosis not present

## 2023-08-16 DIAGNOSIS — I5032 Chronic diastolic (congestive) heart failure: Secondary | ICD-10-CM | POA: Diagnosis not present

## 2023-08-16 DIAGNOSIS — R079 Chest pain, unspecified: Secondary | ICD-10-CM | POA: Diagnosis not present

## 2023-08-16 DIAGNOSIS — E119 Type 2 diabetes mellitus without complications: Secondary | ICD-10-CM | POA: Insufficient documentation

## 2023-08-16 DIAGNOSIS — E7800 Pure hypercholesterolemia, unspecified: Secondary | ICD-10-CM

## 2023-08-16 DIAGNOSIS — Z7984 Long term (current) use of oral hypoglycemic drugs: Secondary | ICD-10-CM | POA: Insufficient documentation

## 2023-08-16 DIAGNOSIS — I110 Hypertensive heart disease with heart failure: Secondary | ICD-10-CM | POA: Insufficient documentation

## 2023-08-16 DIAGNOSIS — R0789 Other chest pain: Secondary | ICD-10-CM | POA: Diagnosis not present

## 2023-08-16 DIAGNOSIS — I509 Heart failure, unspecified: Secondary | ICD-10-CM | POA: Insufficient documentation

## 2023-08-16 DIAGNOSIS — R600 Localized edema: Secondary | ICD-10-CM | POA: Insufficient documentation

## 2023-08-16 DIAGNOSIS — R0989 Other specified symptoms and signs involving the circulatory and respiratory systems: Secondary | ICD-10-CM | POA: Diagnosis not present

## 2023-08-16 LAB — CBC
HCT: 48.6 % (ref 39.0–52.0)
Hemoglobin: 15.9 g/dL (ref 13.0–17.0)
MCH: 27.7 pg (ref 26.0–34.0)
MCHC: 32.7 g/dL (ref 30.0–36.0)
MCV: 84.8 fL (ref 80.0–100.0)
Platelets: 244 10*3/uL (ref 150–400)
RBC: 5.73 MIL/uL (ref 4.22–5.81)
RDW: 14.8 % (ref 11.5–15.5)
WBC: 9.8 10*3/uL (ref 4.0–10.5)
nRBC: 0 % (ref 0.0–0.2)

## 2023-08-16 LAB — COMPREHENSIVE METABOLIC PANEL WITH GFR
ALT: 24 U/L (ref 0–44)
AST: 17 U/L (ref 15–41)
Albumin: 4.2 g/dL (ref 3.5–5.0)
Alkaline Phosphatase: 51 U/L (ref 38–126)
Anion gap: 10 (ref 5–15)
BUN: 14 mg/dL (ref 6–20)
CO2: 23 mmol/L (ref 22–32)
Calcium: 9.1 mg/dL (ref 8.9–10.3)
Chloride: 104 mmol/L (ref 98–111)
Creatinine, Ser: 0.78 mg/dL (ref 0.61–1.24)
GFR, Estimated: >60 mL/min (ref >60)
Glucose, Bld: 134 mg/dL — ABNORMAL HIGH (ref 70–99)
Potassium: 3.8 mmol/L (ref 3.5–5.1)
Sodium: 137 mmol/L (ref 135–145)
Total Bilirubin: 0.7 mg/dL (ref 0.0–1.2)
Total Protein: 7.1 g/dL (ref 6.5–8.1)

## 2023-08-16 LAB — BRAIN NATRIURETIC PEPTIDE: B Natriuretic Peptide: 17.5 pg/mL (ref 0.0–100.0)

## 2023-08-16 LAB — D-DIMER, QUANTITATIVE: D-Dimer, Quant: 0.57 ug{FEU}/mL — ABNORMAL HIGH (ref 0.00–0.50)

## 2023-08-16 LAB — TROPONIN I (HIGH SENSITIVITY)
Troponin I (High Sensitivity): 4 ng/L (ref <18)
Troponin I (High Sensitivity): 4 ng/L (ref <18)

## 2023-08-16 MED ORDER — NITROGLYCERIN 0.4 MG SL SUBL
0.4000 mg | SUBLINGUAL_TABLET | SUBLINGUAL | 3 refills | Status: AC | PRN
Start: 2023-08-16 — End: 2023-11-14

## 2023-08-16 MED ORDER — ISOSORBIDE MONONITRATE ER 30 MG PO TB24: 30.0000 mg | ORAL_TABLET | Freq: Every day | ORAL | 3 refills | Status: AC

## 2023-08-16 MED ORDER — NITROGLYCERIN 0.4 MG SL SUBL
0.4000 mg | SUBLINGUAL_TABLET | SUBLINGUAL | Status: AC | PRN
  Administered 2023-08-16: 0.4 mg via SUBLINGUAL

## 2023-08-16 NOTE — Patient Instructions (Addendum)
 GO TO THE EMERGENCY ROOM FOR CHEST PAIN.  Medication Instructions:  START Imdur 15mg  Tke 1 tablet once a day (tablet comes in 30mg  break tablet in half)  START Nitroglycerin 0.4mg  Take 1 as needed for emergency chest pain. Take first dose for emergency chest pain; WAIT 5 minutes and then take 2nd dose. IF still having pain if still having chest pain CALL 911 wait an additional 5 minutes before taking final dose. Do not take more than 3 doses in a day. *If you need a refill on your cardiac medications before your next appointment, please call your pharmacy*  Lab Work: None ordered If you have labs (blood work) drawn today and your tests are completely normal, you will receive your results only by: MyChart Message (if you have MyChart) OR A paper copy in the mail If you have any lab test that is abnormal or we need to change your treatment, we will call you to review the results.  Testing/Procedures: Your physician has requested that you have a lexiscan myoview. For further information please visit [email or web address] Please follow instruction sheet, as given.  Your physician has requested that you have an echocardiogram. Echocardiography is a painless test that uses sound waves to create images of your heart. It provides your doctor with information about the size and shape of your heart and how well your heart's chambers and valves are working. This procedure takes approximately one hour. There are no restrictions for this procedure. Please do NOT wear cologne, perfume, aftershave, or lotions (deodorant is allowed). Please arrive 15 minutes prior to your appointment time.  Please note: We ask at that you not bring children with you during ultrasound (echo/ vascular) testing. Due to room size and safety concerns, children are not allowed in the ultrasound rooms during exams. Our front office staff cannot provide observation of children in our lobby area while testing is being conducted. An adult  accompanying a patient to their appointment will only be allowed in the ultrasound room at the discretion of the ultrasound technician under special circumstances. We apologize for any inconvenience.   Follow-Up: At [facility], you and your health needs are our priority.  As part of our continuing mission to provide you with exceptional heart care, our providers are all part of one team.  This team includes your primary Cardiologist (physician) and Advanced Practice Providers or APPs (Physician Assistants and Nurse Practitioners) who all work together to provide you with the care you need, when you need it.  Your next appointment:   1 month(s)  Provider:   Robin Searing, NP       We recommend signing up for the patient portal called "MyChart".  Sign up information is provided on this After Visit Summary.  MyChart is used to connect with patients for Virtual Visits (Telemedicine).  Patients are able to view lab/test results, encounter notes, upcoming appointments, etc.  Non-urgent messages can be sent to your provider as well.   To learn more about what you can do with MyChart, go to [email or web address].   Other Instructions       1st Floor: - Lobby - Registration  - Pharmacy  - Lab - Cafe  2nd Floor: - PV Lab - Diagnostic Testing (echo, CT, nuclear med)  3rd Floor: - Vacant  4th Floor: - TCTS (cardiothoracic surgery) - AFib Clinic - Structural Heart Clinic - Vascular Surgery  - Vascular Ultrasound  5th Floor: - HeartCare Cardiology (general and EP) - Clinical Pharmacy for coumadin,  hypertension, lipid, weight-loss medications, and med management appointments    Valet parking services will be available as well.

## 2023-08-16 NOTE — ED Provider Notes (Signed)
 [place] EMERGENCY DEPARTMENT AT [facility] Provider Note   CSN: [id number] Arrival date & time: 08/16/23  7829     History Chief Complaint  Patient presents with   Chest Pain    Darren Sweeney is a 53 y.o. male.  Patient with past history segment for congestive heart failure, hypertension, hypercholesterolemia, tobacco use, type 2 diabetes presents to the emergency department today with concerns of chest pain.  He reports that he has been experiencing primarily exertional angina and dyspnea over the last several months but feels that this has been worsening becoming more persistent and last several weeks.  He states that he is currently taking all his medications as prescribed with no missed doses.  He reports pain is typically worsened when he tries to stand up and walk as well as climb stairs.  Reports improvement with rest.  At rest, denies any significant feelings of shortness of breath.  Denies any notable weight gain but also reports he does not weigh himself frequently.  At cardiology office earlier today, patient received a dose of nitroglycerin which improved his chest pain.  He is currently pain-free.   Chest Pain      Home Medications Prior to Admission medications   Medication Sig Start Date End Date Taking? Authorizing Provider  albuterol (VENTOLIN HFA) 108 (90 Base) MCG/ACT inhaler TAKE 2 PUFFS BY MOUTH EVERY 6 HOURS AS NEEDED FOR WHEEZE OR SHORTNESS OF BREATH 04/28/23   Jodelle Gross, NP  APPLE CIDER VINEGAR PO Take 1 capsule by mouth every evening.    [provider]  atorvastatin (LIPITOR) 10 MG tablet Take 10 mg by mouth every morning.    [provider]  empagliflozin (JARDIANCE) 25 MG TABS tablet Take 1 tablet (25 mg total) by mouth daily before breakfast. 06/26/23   Maisie Fus, MD  etodolac (LODINE XL) 500 MG 24 hr tablet Take 1 tablet by mouth 2 (two) times daily with a meal.    [provider]  ezetimibe (ZETIA)  10 MG tablet Take 10 mg by mouth every morning.    [provider]  fenofibrate micronized (LOFIBRA) 200 MG capsule Take 200 mg by mouth daily. 10/30/19   [provider]  folic acid (FOLVITE) 1 MG tablet Take 1 mg by mouth 2 (two) times daily.    [provider]  furosemide (LASIX) 20 MG tablet TAKE 1 TABLET (20 MG TOTAL) BY MOUTH DAILY. 05/22/23   Maisie Fus, MD  glipiZIDE (GLUCOTROL) 5 MG tablet Take 5 mg by mouth daily before breakfast. 02/24/21   [provider]  golimumab (SIMPONI ARIA) 50 MG/4ML SOLN injection 2mg /kg    [provider]  isosorbide mononitrate (IMDUR) 30 MG 24 hr tablet Take 1 tablet (30 mg total) by mouth daily. 08/16/23 11/14/23  Gaston Islam., NP  metFORMIN (GLUCOPHAGE) 1000 MG tablet Take 1,000 mg by mouth 2 (two) times daily with a meal.    [provider]  methotrexate 50 MG/2ML injection Inject 0.8 mLs (20 mg total) into the skin once a week. Patient taking differently: Inject 20 mg into the skin every Sunday. Inject 0.26ml (20mg ) 06/03/16   Pollyann Savoy, MD  metoprolol succinate (TOPROL-XL) 50 MG 24 hr tablet Take 1 tablet (50 mg total) by mouth daily. Take with or immediately following a meal. 11/16/22   Maisie Fus, MD  Misc Natural Products (TART CHERRY ADVANCED PO) Take 1 capsule by mouth every morning.     [provider]  nitroGLYCERIN (NITROSTAT) 0.4 MG SL tablet Place 1 tablet (0.4 mg total) under the tongue every 5 (five) minutes as needed. 08/16/23 11/14/23  Gaston Islam., NP  Omega 3 1200 MG CAPS Take 1,200 mg by mouth at bedtime.    [provider]  omeprazole (PRILOSEC) 40 MG capsule Take 40 mg by mouth daily with breakfast. 03/30/15   [provider]      Allergies    Patient has no known allergies.    Review of Systems   Review of Systems  Cardiovascular:  Positive for chest pain.  All other systems reviewed and are negative.   Physical Exam Updated Vital  Signs BP 110/81   Pulse 84   Temp 99.1 F (37.3 C) (Oral)   Resp (!) 26   SpO2 95%  Physical Exam Vitals and nursing note reviewed.  Constitutional:      General: He is not in acute distress.    Appearance: He is well-developed. He is obese. He is not ill-appearing.  HENT:     Head: Normocephalic and atraumatic.  Eyes:     Conjunctiva/sclera: Conjunctivae normal.  Cardiovascular:     Rate and Rhythm: Normal rate and regular rhythm.     Heart sounds: No murmur heard. Pulmonary:     Effort: Pulmonary effort is normal. No respiratory distress.     Breath sounds: Examination of the right-middle field reveals rhonchi. Examination of the left-middle field reveals rhonchi. Rhonchi present. No decreased breath sounds, wheezing or rales.  Abdominal:     Palpations: Abdomen is soft.     Tenderness: There is no abdominal tenderness.  Musculoskeletal:        General: No swelling.     Cervical back: Neck supple.     Right lower leg: 1+ Edema present.     Left lower leg: 2+ Edema present.  Skin:    General: Skin is warm and dry.     Capillary Refill: Capillary refill takes less than 2 seconds.  Neurological:     Mental Status: He is alert.  Psychiatric:        Mood and Affect: Mood normal.     ED Results / Procedures / Treatments   Labs (all labs ordered are listed, but only abnormal results are displayed) Labs Reviewed  COMPREHENSIVE METABOLIC PANEL WITH GFR - Abnormal; Notable for the following components:      Result Value   Glucose, Bld 134 (*)    All other components within normal limits  D-DIMER, QUANTITATIVE - Abnormal; Notable for the following components:   D-Dimer, Quant 0.57 (*)    All other components within normal limits  CBC  BRAIN NATRIURETIC PEPTIDE  TROPONIN I (HIGH SENSITIVITY)  TROPONIN I (HIGH SENSITIVITY)    EKG None  Radiology DG Chest 2 View Result Date: 08/16/2023 CLINICAL DATA:  Chest pain EXAM: CHEST - 2 VIEW COMPARISON:  08/14/2013 FINDINGS:  Normal cardiac silhouette. Central venous congestion. No effusion, infiltrate, pneumothorax. No acute osseous abnormality. IMPRESSION: Central venous congestion. Electronically Signed   By: Genevive Bi M.D.   On: 08/16/2023 11:09    Procedures Procedures    Medications Ordered in ED Medications - No data to display  ED Course/ Medical Decision Making/ A&P                                 Medical Decision Making Amount and/or Complexity of Data Reviewed  Labs: ordered. Radiology: ordered.   This patient presents to the ED for concern of chest pain.  Differential diagnosis includes STEMI, NSTEMI, PE, pneumonia, bronchitis, CHF exacerbation   Lab Tests:  I Ordered, and personally interpreted labs.  The pertinent results include: CBC and CMP unremarkable, BMP negative, D-dimer slightly elevated but negative by years criteria, troponin normal at 4   Imaging Studies ordered:  I ordered imaging studies including chest x-ray I independently visualized and interpreted imaging which showed central venous congestion I agree with the radiologist interpretation   Problem List / ED Course:  Patient patient with past history significant for hypertension, congestive heart failure, hypercholesterolemia, tobacco use, type 2 diabetes presents to the ED with concerns of chest pain.  Patient was seen by his cardiologist earlier today and was advised to come in for evaluation for concerns of chest pain.  Patient received a dose of nitroglycerin at his cardiologist office which did resolve his symptoms.  States in the last several weeks, has had worsening chest pain and shortness of breath on exertion.  He states he is compliant with all his medications. Physical exam is unremarkable.  There is no no abnormal heart or lung sounds.  Patient has 2+ edema 1+ edema on the left and right legs respectively.  Will initiate cardiac workup for assessment of symptoms.  Initial EKG shows patient is not  acutely experiencing NSTEMI. Lab work is unremarkable.  CBC, CMP, BNP and troponin unremarkable.  D-dimer slightly low at 0.57, negative by years criteria. With reassuring workup and no current symptoms, patient is otherwise stable for outpatient follow up and discharged home with plans for cardiology follow up.  Final Clinical Impression(s) / ED Diagnoses Final diagnoses:  Chest pain of uncertain etiology    Rx / DC Orders ED Discharge Orders     None         Smitty Knudsen, PA-C 08/16/23 1453    Rexford Maus, DO 08/17/23 [phone]

## 2023-08-16 NOTE — ED Triage Notes (Signed)
 Pt is here due to chest pain for several months which is initially was intermittent and only with activity but it has gotten worse and can also be felt at rest now.  Pt was seen by Cardiology this am and was given nitroglycerin which relieved the pain for about 20-30 minutes and cardiology advised him to come to ED.  Pt describes sob and fatigue and activity intolerance.  Pt smokes a pack a day

## 2023-08-16 NOTE — Discharge Instructions (Addendum)
 You were seen in the ER today for concerns of chest pain. Your labs, EKG, and imaging were reassuring with no abnormal findings seen. I spoke with your cardiology group today who had sent you for evaluation and they will plan on following up with you outpatient for further testing. Please ensure you can follow up with them in the next week. Return to the ER for any concerns of new or worsening symptoms.

## 2023-08-16 NOTE — ED Notes (Signed)
 Discharge instructions and follow up with cardiology reviewed and explained to pt who verbalized understanding, however expressed frustration in "being told nothing is wrong with him." RN spoke with pt about the testing that was done here and as the PA explained to him his cardiac values were normal and that PA spoke with his cardiologist who recommended him to further follow up with them in the outpatient setting. RN offered to get provider to speak with him again but pt refused and states he just wants to leave.

## 2023-08-16 NOTE — Telephone Encounter (Signed)
 Spoke with wife per DPR and she states patient did go get labs and xray. Patient is currently laying down but states he still have the same symptoms and he feels worse. Advised if his symptoms are getting worse he may need to go back to the ED. Wife stated they were told patient may have pneumonia from xray. Will forward to provider

## 2023-08-16 NOTE — Telephone Encounter (Signed)
 Pt was told to get an x-ray today after visit and F/U a week later. Pt also states that he's still having the same symptoms after leaving appt today and would like a nurse to c/b. Please advise

## 2023-08-17 ENCOUNTER — Telehealth: Payer: Self-pay | Admitting: Nurse Practitioner

## 2023-08-17 DIAGNOSIS — M79605 Pain in left leg: Secondary | ICD-10-CM

## 2023-08-17 DIAGNOSIS — R0602 Shortness of breath: Secondary | ICD-10-CM

## 2023-08-17 DIAGNOSIS — R918 Other nonspecific abnormal finding of lung field: Secondary | ICD-10-CM

## 2023-08-17 DIAGNOSIS — R200 Anesthesia of skin: Secondary | ICD-10-CM

## 2023-08-17 DIAGNOSIS — R071 Chest pain on breathing: Secondary | ICD-10-CM

## 2023-08-17 DIAGNOSIS — M79602 Pain in left arm: Secondary | ICD-10-CM

## 2023-08-17 NOTE — Telephone Encounter (Signed)
 Called patient's wife back about message. Patient went ED yesterday. Patient was sent home. After arriving home, patient took Imdur and chest pain was relieved. Now patient is still complaining of upper arm shoulder pain and numbness, and pain and discoloration in left leg and ankle. Patient is still SOB and has pain with deep breaths. Talked to Alden Server, he advised to get ultrasounds of the BLE and BUE to chest circulation and rule out DVT in left leg. Patient also will have Chest CT to rule out DVT and follow-up on lung nodules on last CT.

## 2023-08-17 NOTE — Telephone Encounter (Signed)
 Pt wife calling in regards to medication changes that were discussed at the appt yesterday. Please advise

## 2023-08-22 ENCOUNTER — Encounter (HOSPITAL_COMMUNITY): Payer: Self-pay

## 2023-08-22 DIAGNOSIS — R5383 Other fatigue: Secondary | ICD-10-CM | POA: Diagnosis not present

## 2023-08-22 DIAGNOSIS — Z79899 Other long term (current) drug therapy: Secondary | ICD-10-CM | POA: Diagnosis not present

## 2023-08-22 DIAGNOSIS — Z111 Encounter for screening for respiratory tuberculosis: Secondary | ICD-10-CM | POA: Diagnosis not present

## 2023-08-22 DIAGNOSIS — M0579 Rheumatoid arthritis with rheumatoid factor of multiple sites without organ or systems involvement: Secondary | ICD-10-CM | POA: Diagnosis not present

## 2023-08-23 ENCOUNTER — Ambulatory Visit (HOSPITAL_COMMUNITY)
Admission: RE | Admit: 2023-08-23 | Discharge: 2023-08-23 | Disposition: A | Discharge status: Home / Self Care | Source: Ambulatory Visit | Attending: Nurse Practitioner | Admitting: Nurse Practitioner

## 2023-08-23 DIAGNOSIS — R918 Other nonspecific abnormal finding of lung field: Secondary | ICD-10-CM | POA: Diagnosis not present

## 2023-08-23 DIAGNOSIS — R079 Chest pain, unspecified: Secondary | ICD-10-CM | POA: Diagnosis not present

## 2023-08-23 DIAGNOSIS — R071 Chest pain on breathing: Secondary | ICD-10-CM | POA: Diagnosis not present

## 2023-08-23 DIAGNOSIS — R0602 Shortness of breath: Secondary | ICD-10-CM | POA: Insufficient documentation

## 2023-08-23 MED ORDER — IOHEXOL 350 MG/ML SOLN
75.0000 mL | Freq: Once | INTRAVENOUS | Status: AC | PRN
  Administered 2023-08-23: 75 mL via INTRAVENOUS

## 2023-08-24 DIAGNOSIS — I10 Essential (primary) hypertension: Secondary | ICD-10-CM | POA: Diagnosis not present

## 2023-08-24 DIAGNOSIS — E1159 Type 2 diabetes mellitus with other circulatory complications: Secondary | ICD-10-CM | POA: Diagnosis not present

## 2023-08-30 ENCOUNTER — Ambulatory Visit (HOSPITAL_COMMUNITY): Attending: Cardiology

## 2023-08-30 DIAGNOSIS — I5032 Chronic diastolic (congestive) heart failure: Secondary | ICD-10-CM | POA: Diagnosis not present

## 2023-08-30 DIAGNOSIS — Z720 Tobacco use: Secondary | ICD-10-CM | POA: Insufficient documentation

## 2023-08-30 DIAGNOSIS — I10 Essential (primary) hypertension: Secondary | ICD-10-CM | POA: Insufficient documentation

## 2023-08-30 DIAGNOSIS — E7800 Pure hypercholesterolemia, unspecified: Secondary | ICD-10-CM | POA: Insufficient documentation

## 2023-08-30 DIAGNOSIS — E119 Type 2 diabetes mellitus without complications: Secondary | ICD-10-CM | POA: Diagnosis not present

## 2023-08-30 MED ORDER — TECHNETIUM TC 99M TETROFOSMIN IV KIT
31.0000 | PACK | Freq: Once | INTRAVENOUS | Status: AC | PRN
  Administered 2023-08-30: 31 via INTRAVENOUS

## 2023-08-30 MED ORDER — REGADENOSON 0.4 MG/5ML IV SOLN
0.4000 mg | Freq: Once | INTRAVENOUS | Status: AC
  Administered 2023-08-30: 0.4 mg via INTRAVENOUS

## 2023-08-31 ENCOUNTER — Ambulatory Visit (HOSPITAL_COMMUNITY)

## 2023-08-31 LAB — MYOCARDIAL PERFUSION IMAGING
LV dias vol: 91 mL (ref 62–150)
LV sys vol: 38 mL
Nuc Stress EF: 58 %
Peak HR: 104 {beats}/min
Rest HR: 88 {beats}/min
Rest Nuclear Isotope Dose: 30.7 mCi
SDS: 1
SRS: 0
SSS: 1
ST Depression (mm): 0 mm
Stress Nuclear Isotope Dose: 31 mCi
TID: 0.87

## 2023-08-31 MED ORDER — TECHNETIUM TC 99M TETROFOSMIN IV KIT
30.7000 | PACK | Freq: Once | INTRAVENOUS | Status: AC | PRN
  Administered 2023-08-31: 30.7 via INTRAVENOUS

## 2023-09-07 ENCOUNTER — Ambulatory Visit (HOSPITAL_BASED_OUTPATIENT_CLINIC_OR_DEPARTMENT_OTHER)
Admission: RE | Admit: 2023-09-07 | Discharge: 2023-09-07 | Disposition: A | Discharge status: Home / Self Care | Source: Ambulatory Visit | Attending: Nurse Practitioner | Admitting: Nurse Practitioner

## 2023-09-07 ENCOUNTER — Ambulatory Visit (HOSPITAL_COMMUNITY)
Admission: RE | Admit: 2023-09-07 | Discharge: 2023-09-07 | Disposition: A | Discharge status: Home / Self Care | Source: Ambulatory Visit | Attending: Cardiology | Admitting: Cardiology

## 2023-09-07 DIAGNOSIS — M79605 Pain in left leg: Secondary | ICD-10-CM

## 2023-09-07 DIAGNOSIS — R200 Anesthesia of skin: Secondary | ICD-10-CM

## 2023-09-07 DIAGNOSIS — M79602 Pain in left arm: Secondary | ICD-10-CM

## 2023-09-13 DIAGNOSIS — M0579 Rheumatoid arthritis with rheumatoid factor of multiple sites without organ or systems involvement: Secondary | ICD-10-CM | POA: Diagnosis not present

## 2023-09-13 DIAGNOSIS — M25561 Pain in right knee: Secondary | ICD-10-CM | POA: Diagnosis not present

## 2023-09-13 DIAGNOSIS — Z79899 Other long term (current) drug therapy: Secondary | ICD-10-CM | POA: Diagnosis not present

## 2023-09-13 DIAGNOSIS — M25562 Pain in left knee: Secondary | ICD-10-CM | POA: Diagnosis not present

## 2023-09-15 ENCOUNTER — Ambulatory Visit: Admitting: Nurse Practitioner

## 2023-09-18 ENCOUNTER — Ambulatory Visit (HOSPITAL_COMMUNITY): Attending: Cardiovascular Disease

## 2023-09-18 DIAGNOSIS — Z720 Tobacco use: Secondary | ICD-10-CM | POA: Insufficient documentation

## 2023-09-18 DIAGNOSIS — E119 Type 2 diabetes mellitus without complications: Secondary | ICD-10-CM | POA: Diagnosis not present

## 2023-09-18 DIAGNOSIS — I10 Essential (primary) hypertension: Secondary | ICD-10-CM | POA: Diagnosis not present

## 2023-09-18 DIAGNOSIS — I5032 Chronic diastolic (congestive) heart failure: Secondary | ICD-10-CM | POA: Diagnosis not present

## 2023-09-18 DIAGNOSIS — E7800 Pure hypercholesterolemia, unspecified: Secondary | ICD-10-CM | POA: Insufficient documentation

## 2023-09-18 LAB — ECHOCARDIOGRAM COMPLETE
Area-P 1/2: 4.02 cm2
S' Lateral: 3.6 cm

## 2023-09-20 ENCOUNTER — Inpatient Hospital Stay (HOSPITAL_COMMUNITY): Admission: RE | Admit: 2023-09-20 | Source: Ambulatory Visit

## 2023-09-20 ENCOUNTER — Encounter (HOSPITAL_COMMUNITY)

## 2023-09-26 ENCOUNTER — Encounter: Payer: Self-pay | Admitting: Nurse Practitioner

## 2023-10-17 DIAGNOSIS — Z79899 Other long term (current) drug therapy: Secondary | ICD-10-CM | POA: Diagnosis not present

## 2023-10-17 DIAGNOSIS — M0579 Rheumatoid arthritis with rheumatoid factor of multiple sites without organ or systems involvement: Secondary | ICD-10-CM | POA: Diagnosis not present

## 2023-12-12 DIAGNOSIS — M0579 Rheumatoid arthritis with rheumatoid factor of multiple sites without organ or systems involvement: Secondary | ICD-10-CM | POA: Diagnosis not present

## 2023-12-12 DIAGNOSIS — Z79899 Other long term (current) drug therapy: Secondary | ICD-10-CM | POA: Diagnosis not present

## 2023-12-13 ENCOUNTER — Other Ambulatory Visit: Payer: Self-pay

## 2023-12-13 MED ORDER — METOPROLOL SUCCINATE ER 50 MG PO TB24: 50.0000 mg | ORAL_TABLET | Freq: Every day | ORAL | 2 refills | Status: AC

## 2024-01-18 ENCOUNTER — Other Ambulatory Visit: Payer: Self-pay | Admitting: Nurse Practitioner

## 2024-01-18 MED ORDER — FUROSEMIDE 20 MG PO TABS: 20.0000 mg | ORAL_TABLET | Freq: Every day | ORAL | 2 refills | Status: AC

## 2024-02-06 DIAGNOSIS — M0579 Rheumatoid arthritis with rheumatoid factor of multiple sites without organ or systems involvement: Secondary | ICD-10-CM | POA: Diagnosis not present

## 2024-02-06 DIAGNOSIS — Z79899 Other long term (current) drug therapy: Secondary | ICD-10-CM | POA: Diagnosis not present

## 2024-03-13 DIAGNOSIS — M0579 Rheumatoid arthritis with rheumatoid factor of multiple sites without organ or systems involvement: Secondary | ICD-10-CM | POA: Diagnosis not present

## 2024-03-13 DIAGNOSIS — M25561 Pain in right knee: Secondary | ICD-10-CM | POA: Diagnosis not present

## 2024-03-13 DIAGNOSIS — Z79899 Other long term (current) drug therapy: Secondary | ICD-10-CM | POA: Diagnosis not present

## 2024-03-13 DIAGNOSIS — M25562 Pain in left knee: Secondary | ICD-10-CM | POA: Diagnosis not present

## 2024-04-02 DIAGNOSIS — M0579 Rheumatoid arthritis with rheumatoid factor of multiple sites without organ or systems involvement: Secondary | ICD-10-CM | POA: Diagnosis not present

## 2024-05-06 DIAGNOSIS — E782 Mixed hyperlipidemia: Secondary | ICD-10-CM | POA: Diagnosis not present

## 2024-05-06 DIAGNOSIS — I10 Essential (primary) hypertension: Secondary | ICD-10-CM | POA: Diagnosis not present

## 2024-05-06 DIAGNOSIS — E1159 Type 2 diabetes mellitus with other circulatory complications: Secondary | ICD-10-CM | POA: Diagnosis not present

## 2024-05-06 DIAGNOSIS — E669 Obesity, unspecified: Secondary | ICD-10-CM | POA: Diagnosis not present
